# Patient Record
Sex: Male | Born: 1960 | Race: White | Hispanic: No | Marital: Married | Smoking: Former smoker
Health system: Southern US, Community
[De-identification: ages and names within clinical notes are randomized; demographics above are authoritative.]

## PROBLEM LIST (undated history)

## (undated) DIAGNOSIS — I10 Essential (primary) hypertension: Secondary | ICD-10-CM

## (undated) DIAGNOSIS — I219 Acute myocardial infarction, unspecified: Secondary | ICD-10-CM

## (undated) DIAGNOSIS — M549 Dorsalgia, unspecified: Secondary | ICD-10-CM

## (undated) DIAGNOSIS — Z87898 Personal history of other specified conditions: Secondary | ICD-10-CM

## (undated) DIAGNOSIS — K219 Gastro-esophageal reflux disease without esophagitis: Secondary | ICD-10-CM

## (undated) DIAGNOSIS — E119 Type 2 diabetes mellitus without complications: Secondary | ICD-10-CM

## (undated) DIAGNOSIS — I2510 Atherosclerotic heart disease of native coronary artery without angina pectoris: Secondary | ICD-10-CM

## (undated) DIAGNOSIS — M4712 Other spondylosis with myelopathy, cervical region: Secondary | ICD-10-CM

## (undated) DIAGNOSIS — M1990 Unspecified osteoarthritis, unspecified site: Secondary | ICD-10-CM

## (undated) DIAGNOSIS — E785 Hyperlipidemia, unspecified: Secondary | ICD-10-CM

## (undated) HISTORY — PX: TONSILLECTOMY: SUR1361

## (undated) HISTORY — DX: Dorsalgia, unspecified: M54.9

## (undated) HISTORY — PX: CORONARY ANGIOPLASTY: SHX604

## (undated) HISTORY — DX: Atherosclerotic heart disease of native coronary artery without angina pectoris: I25.10

## (undated) HISTORY — PX: WISDOM TOOTH EXTRACTION: SHX21

## (undated) HISTORY — DX: Hyperlipidemia, unspecified: E78.5

---

## 2009-06-01 DIAGNOSIS — I219 Acute myocardial infarction, unspecified: Secondary | ICD-10-CM

## 2009-06-01 HISTORY — DX: Acute myocardial infarction, unspecified: I21.9

## 2009-12-07 ENCOUNTER — Emergency Department: Payer: Self-pay | Admitting: Emergency Medicine

## 2010-01-09 ENCOUNTER — Encounter: Payer: Self-pay | Admitting: Cardiothoracic Surgery

## 2010-01-30 ENCOUNTER — Encounter: Payer: Self-pay | Admitting: Cardiothoracic Surgery

## 2010-03-01 ENCOUNTER — Encounter: Payer: Self-pay | Admitting: Cardiothoracic Surgery

## 2011-09-08 ENCOUNTER — Observation Stay: Payer: Self-pay | Admitting: Internal Medicine

## 2011-09-08 LAB — CBC
HGB: 14.2 g/dL (ref 13.0–18.0)
MCHC: 33.3 g/dL (ref 32.0–36.0)
MCV: 88 fL (ref 80–100)
RBC: 4.84 10*6/uL (ref 4.40–5.90)
RDW: 12.4 % (ref 11.5–14.5)

## 2011-09-08 LAB — COMPREHENSIVE METABOLIC PANEL
Alkaline Phosphatase: 83 U/L (ref 50–136)
Anion Gap: 8 (ref 7–16)
BUN: 14 mg/dL (ref 7–18)
Calcium, Total: 8.7 mg/dL (ref 8.5–10.1)
Chloride: 104 mmol/L (ref 98–107)
Co2: 27 mmol/L (ref 21–32)
Creatinine: 0.99 mg/dL (ref 0.60–1.30)
EGFR (African American): >60
Glucose: 139 mg/dL — ABNORMAL HIGH (ref 65–99)
Osmolality: 280 (ref 275–301)
Potassium: 3.1 mmol/L — ABNORMAL LOW (ref 3.5–5.1)
SGPT (ALT): 48 U/L
Total Protein: 7.3 g/dL (ref 6.4–8.2)

## 2011-09-08 LAB — MAGNESIUM: Magnesium: 1.6 mg/dL — ABNORMAL LOW

## 2011-09-08 LAB — CK TOTAL AND CKMB (NOT AT ARMC): CK, Total: 112 U/L (ref 35–232)

## 2011-09-09 LAB — POTASSIUM: Potassium: 3.8 mmol/L (ref 3.5–5.1)

## 2011-09-09 LAB — CK-MB
CK-MB: 0.8 ng/mL (ref 0.5–3.6)
CK-MB: 0.8 ng/mL (ref 0.5–3.6)

## 2011-09-09 LAB — MAGNESIUM: Magnesium: 1.6 mg/dL — ABNORMAL LOW

## 2011-09-09 LAB — TROPONIN I: Troponin-I: <0.02 ng/mL

## 2013-10-03 ENCOUNTER — Ambulatory Visit: Payer: Self-pay

## 2013-12-11 ENCOUNTER — Ambulatory Visit: Payer: Self-pay | Admitting: Neurosurgery

## 2013-12-29 ENCOUNTER — Other Ambulatory Visit: Payer: Self-pay | Admitting: Neurosurgery

## 2014-01-01 ENCOUNTER — Encounter (HOSPITAL_COMMUNITY)
Admission: RE | Admit: 2014-01-01 | Discharge: 2014-01-01 | Disposition: A | Discharge status: Home / Self Care | Payer: BC Managed Care – PPO | Source: Ambulatory Visit | Attending: Neurosurgery | Admitting: Neurosurgery

## 2014-01-01 ENCOUNTER — Encounter (HOSPITAL_COMMUNITY)
Admission: RE | Admit: 2014-01-01 | Discharge: 2014-01-01 | Disposition: A | Discharge status: Home / Self Care | Payer: BC Managed Care – PPO | Source: Ambulatory Visit | Attending: Anesthesiology | Admitting: Anesthesiology

## 2014-01-01 ENCOUNTER — Encounter (HOSPITAL_COMMUNITY): Payer: Self-pay

## 2014-01-01 DIAGNOSIS — Z01810 Encounter for preprocedural cardiovascular examination: Secondary | ICD-10-CM | POA: Insufficient documentation

## 2014-01-01 DIAGNOSIS — Z01818 Encounter for other preprocedural examination: Secondary | ICD-10-CM | POA: Insufficient documentation

## 2014-01-01 DIAGNOSIS — I252 Old myocardial infarction: Secondary | ICD-10-CM | POA: Insufficient documentation

## 2014-01-01 DIAGNOSIS — I10 Essential (primary) hypertension: Secondary | ICD-10-CM | POA: Insufficient documentation

## 2014-01-01 DIAGNOSIS — Z01812 Encounter for preprocedural laboratory examination: Secondary | ICD-10-CM | POA: Insufficient documentation

## 2014-01-01 DIAGNOSIS — K219 Gastro-esophageal reflux disease without esophagitis: Secondary | ICD-10-CM | POA: Insufficient documentation

## 2014-01-01 DIAGNOSIS — M47812 Spondylosis without myelopathy or radiculopathy, cervical region: Secondary | ICD-10-CM | POA: Insufficient documentation

## 2014-01-01 DIAGNOSIS — Z87891 Personal history of nicotine dependence: Secondary | ICD-10-CM | POA: Insufficient documentation

## 2014-01-01 HISTORY — DX: Other spondylosis with myelopathy, cervical region: M47.12

## 2014-01-01 HISTORY — DX: Acute myocardial infarction, unspecified: I21.9

## 2014-01-01 HISTORY — DX: Gastro-esophageal reflux disease without esophagitis: K21.9

## 2014-01-01 HISTORY — DX: Essential (primary) hypertension: I10

## 2014-01-01 HISTORY — DX: Personal history of other specified conditions: Z87.898

## 2014-01-01 LAB — SURGICAL PCR SCREEN
MRSA, PCR: NEGATIVE
STAPHYLOCOCCUS AUREUS: NEGATIVE

## 2014-01-01 LAB — BASIC METABOLIC PANEL
ANION GAP: 12 (ref 5–15)
BUN: 12 mg/dL (ref 6–23)
CALCIUM: 8.8 mg/dL (ref 8.4–10.5)
CO2: 23 meq/L (ref 19–32)
Chloride: 105 mEq/L (ref 96–112)
Creatinine, Ser: 0.81 mg/dL (ref 0.50–1.35)
Glucose, Bld: 105 mg/dL — ABNORMAL HIGH (ref 70–99)
Potassium: 4.8 mEq/L (ref 3.7–5.3)
SODIUM: 140 meq/L (ref 137–147)

## 2014-01-01 LAB — CBC
HEMATOCRIT: 38.2 % — AB (ref 39.0–52.0)
Hemoglobin: 13.6 g/dL (ref 13.0–17.0)
MCH: 31.1 pg (ref 26.0–34.0)
MCHC: 35.6 g/dL (ref 30.0–36.0)
MCV: 87.2 fL (ref 78.0–100.0)
PLATELETS: 162 10*3/uL (ref 150–400)
RBC: 4.38 MIL/uL (ref 4.22–5.81)
RDW: 13.6 % (ref 11.5–15.5)
WBC: 7.3 10*3/uL (ref 4.0–10.5)

## 2014-01-01 NOTE — Pre-Procedure Instructions (Signed)
Eddie Alexander  01/01/2014   Your procedure is scheduled on:  Monday, August 10  Report to [facility] Admitting at [place] AM.  Call this number if you have problems the morning of surgery: [phone]   Remember:   Do not eat food or drink liquids after midnight.Sunday night   Take these medicines the morning of surgery with A SIP OF WATER: Carvedilol   Do not wear jewelry.  Do not wear lotions, powders, or perfumes. You may wear deodorant.  Do not shave 48 hours prior to surgery. Men may shave face and neck.  Do not bring valuables to the hospital.  [place] is not responsible    for any belongings or valuables.               Contacts, dentures or bridgework may not be worn into surgery.  Leave suitcase in the car. After surgery it may be brought to your room.  For patients admitted to the hospital, discharge time is determined by your   treatment team.               Patients discharged the day of surgery will not be allowed to drive  home.  Name and phone number of your driver: Spouse Kerry  Special Instructions: [place] - Preparing for Surgery  Before surgery, you can play an important role.  Because skin is not sterile, your skin needs to be as free of germs as possible.  You can reduce the number of germs on you skin by washing with CHG (chlorahexidine gluconate) soap before surgery.  CHG is an antiseptic cleaner which kills germs and bonds with the skin to continue killing germs even after washing.  Please DO NOT use if you have an allergy to CHG or antibacterial soaps.  If your skin becomes reddened/irritated stop using the CHG and inform your nurse when you arrive at Short Stay.  Do not shave (including legs and underarms) for at least 48 hours prior to the first CHG shower.  You may shave your face.  Please follow these instructions carefully:   1.  Shower with CHG Soap the night before surgery and the    morning of Surgery.  2.  If you choose to wash your  hair, wash your hair first as usual with your  normal shampoo.  3.  After you shampoo, rinse your hair and body thoroughly to remove the  Shampoo.  4.  Use CHG as you would any other liquid soap.  You can apply chg directly to the skin and wash gently with scrungie or a clean washcloth.  5.  Apply the CHG Soap to your body ONLY FROM THE NECK DOWN.  Do not use on open wounds or open sores.  Avoid contact with your eyes, ears, mouth and genitals (private parts).  Wash genitals (private parts)   with your normal soap.  6.  Wash thoroughly, paying special attention to the area where your surgery  will be performed.  7.  Thoroughly rinse your body with warm water from the neck down.  8.  DO NOT shower/wash with your normal soap after using and rinsing off   the CHG Soap.  9.  Pat yourself dry with a clean towel.            10.  Wear clean pajamas.            11 .  Place clean sheets on your bed the night of your first  shower and do not sleep with pets.  Day of Surgery  Do not apply any lotions/deoderants the morning of surgery.  Please wear clean clothes to the hospital/surgery center.     Please read over the following fact sheets that you were given: Pain Booklet, Coughing and Deep Breathing and Surgical Site Infection Prevention

## 2014-01-01 NOTE — Progress Notes (Signed)
STOP-Bang score 5; results sent to PCP.

## 2014-01-02 NOTE — Progress Notes (Signed)
Anesthesia Chart Review:  Patient is a 53 year old male scheduled for C5-6. C6-7 ACDF on 01/08/14 by Dr. Lovell SheehanJenkins.  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  He had recurrent ischemia in 03/2012 s/p a second LAD stent (DES). His cardiologist is Dr. Celso AmyJ. Kevin [facility] who gave permission to temporarily hold Plavix on 12/14/13 and ASA one week prior to surgery. BMI is consistent with obesity. PCP is Dr. Vonita MossMark Crissman.  He had an abnormal stress echo on 03/03/12 (EF 50% with inferior defect, trivial TR) which lead to a cardiac cath on 03/23/12 (Care Everywhere) that showed:   Coronary arteries:  Left main: Normal  Left anterior descending: 10% proximal, 80% mid in stent  D2: (large) diffuse 30% mid  Left circumflex: 10% proximal  Ramus (large) 30% proximal  Right coronary: (dominant) 30-40% proximal  Impressions and Recommendations:  1. Single-vessel obstructive coronary artery disease with evidence of restenosis within the vision stents in the mid LAD  (note: Angiographically of the ramus, 2nd diagonal branch and the proximal right coronary artery look less severe in terms of angiographic stenosis than they did on the 2009 catheterization. Given the echo report of inferior ischemia we will proceed with FFR evaluation of the right coronary artery and plan repeat PCI of the LAD.)  Using bivalirudin and 600 mg of p.o. Plavix; FFR of the right coronary artery performed after intracoronary nitroglycerin and during intravenous adenosine infusion. 0.98 at baseline and a minimum of 0.90 during adenosine infusion. I therefore no PCI of the right coronary artery done.  PCI  Percutaneous coronary intervention performed of the 80% mid lesion within prior "bare metal" Vision stents..  Guide catheter: XB3.5  Devices: BMW  gw, 2.25/15 Quantum, 2.25/28 Xience DES deployed 14 atmospheres  Results: Good angiographic result  Impressions and Recommendations:  1-vessel coronary artery disease  Successful PCI  Aspirin 81 mg daily indefinitely  Clopidogrel (Plavix) 75 mg PO daily for at least 12 months.  EKG on 01/01/14 showed: NSR, septal infarct (age undetermined), possible lateral infarct (age undetermined). I can not view any of the previous EKG tracings in Care Everywhere, so I will see if Dr. Mort SawyersHarrison's office will fax a prior tracing copy.  Preoperative CXR and labs noted.   Based on currently available records, I would anticipate that he could proceed as planned since he has already been cleared by his cardiologist.   Shonna ChockAllison Tritia Endo, PA-C [facility]/Anesthesiology Phone [phone] 01/02/2014 3:13 PM

## 2014-01-07 MED ORDER — CEFAZOLIN SODIUM-DEXTROSE 2-3 GM-% IV SOLR: 2.0000 g | INTRAVENOUS | Status: DC

## 2014-01-08 ENCOUNTER — Encounter (HOSPITAL_COMMUNITY): Payer: Self-pay | Admitting: *Deleted

## 2014-01-08 ENCOUNTER — Encounter (HOSPITAL_COMMUNITY)
Admission: RE | Disposition: A | Discharge status: Home / Self Care | Payer: Self-pay | Source: Ambulatory Visit | Attending: Neurosurgery

## 2014-01-08 ENCOUNTER — Encounter (HOSPITAL_COMMUNITY): Payer: BC Managed Care – PPO | Admitting: Vascular Surgery

## 2014-01-08 ENCOUNTER — Ambulatory Visit (HOSPITAL_COMMUNITY): Payer: BC Managed Care – PPO | Admitting: Anesthesiology

## 2014-01-08 ENCOUNTER — Ambulatory Visit (HOSPITAL_COMMUNITY): Payer: BC Managed Care – PPO

## 2014-01-08 ENCOUNTER — Ambulatory Visit (HOSPITAL_COMMUNITY)
Admission: RE | Admit: 2014-01-08 | Discharge: 2014-01-09 | Disposition: A | Discharge status: Home / Self Care | Payer: BC Managed Care – PPO | Source: Ambulatory Visit | Attending: Neurosurgery | Admitting: Neurosurgery

## 2014-01-08 DIAGNOSIS — M5000 Cervical disc disorder with myelopathy, unspecified cervical region: Secondary | ICD-10-CM | POA: Diagnosis not present

## 2014-01-08 DIAGNOSIS — M4712 Other spondylosis with myelopathy, cervical region: Secondary | ICD-10-CM | POA: Insufficient documentation

## 2014-01-08 DIAGNOSIS — I252 Old myocardial infarction: Secondary | ICD-10-CM | POA: Insufficient documentation

## 2014-01-08 DIAGNOSIS — M4722 Other spondylosis with radiculopathy, cervical region: Secondary | ICD-10-CM

## 2014-01-08 DIAGNOSIS — Z7902 Long term (current) use of antithrombotics/antiplatelets: Secondary | ICD-10-CM | POA: Insufficient documentation

## 2014-01-08 DIAGNOSIS — Z87891 Personal history of nicotine dependence: Secondary | ICD-10-CM | POA: Insufficient documentation

## 2014-01-08 DIAGNOSIS — I10 Essential (primary) hypertension: Secondary | ICD-10-CM | POA: Diagnosis not present

## 2014-01-08 DIAGNOSIS — Z7982 Long term (current) use of aspirin: Secondary | ICD-10-CM | POA: Diagnosis not present

## 2014-01-08 DIAGNOSIS — Z6836 Body mass index (BMI) 36.0-36.9, adult: Secondary | ICD-10-CM | POA: Diagnosis not present

## 2014-01-08 DIAGNOSIS — Z9861 Coronary angioplasty status: Secondary | ICD-10-CM | POA: Diagnosis not present

## 2014-01-08 DIAGNOSIS — F430 Acute stress reaction: Secondary | ICD-10-CM | POA: Diagnosis not present

## 2014-01-08 HISTORY — PX: ANTERIOR CERVICAL DECOMP/DISCECTOMY FUSION: SHX1161

## 2014-01-08 SURGERY — ANTERIOR CERVICAL DECOMPRESSION/DISCECTOMY FUSION 2 LEVELS
Anesthesia: General | Site: Neck

## 2014-01-08 MED ORDER — PROMETHAZINE HCL 25 MG/ML IJ SOLN: 6.2500 mg | INTRAMUSCULAR | Status: DC | PRN

## 2014-01-08 MED ORDER — GLYCOPYRROLATE 0.2 MG/ML IJ SOLN
INTRAMUSCULAR | Status: AC
  Filled 2014-01-08: qty 3

## 2014-01-08 MED ORDER — LISINOPRIL 20 MG PO TABS
20.0000 mg | ORAL_TABLET | Freq: Every day | ORAL | Status: DC
  Administered 2014-01-08 – 2014-01-09 (×2): 20 mg via ORAL
  Filled 2014-01-08 (×2): qty 1

## 2014-01-08 MED ORDER — MORPHINE SULFATE 2 MG/ML IJ SOLN: 1.0000 mg | INTRAMUSCULAR | Status: DC | PRN

## 2014-01-08 MED ORDER — DEXAMETHASONE SODIUM PHOSPHATE 10 MG/ML IJ SOLN
INTRAMUSCULAR | Status: AC
  Filled 2014-01-08: qty 1

## 2014-01-08 MED ORDER — DEXAMETHASONE SODIUM PHOSPHATE 4 MG/ML IJ SOLN
4.0000 mg | Freq: Four times a day (QID) | INTRAMUSCULAR | Status: DC
  Filled 2014-01-08 (×3): qty 1

## 2014-01-08 MED ORDER — LIDOCAINE HCL (CARDIAC) 20 MG/ML IV SOLN
INTRAVENOUS | Status: AC
  Filled 2014-01-08: qty 5

## 2014-01-08 MED ORDER — ONDANSETRON HCL 4 MG/2ML IJ SOLN
INTRAMUSCULAR | Status: AC
  Filled 2014-01-08: qty 2

## 2014-01-08 MED ORDER — SUCCINYLCHOLINE CHLORIDE 20 MG/ML IJ SOLN
INTRAMUSCULAR | Status: AC
  Filled 2014-01-08: qty 1

## 2014-01-08 MED ORDER — HYDROMORPHONE HCL PF 1 MG/ML IJ SOLN: 0.2500 mg | INTRAMUSCULAR | Status: DC | PRN

## 2014-01-08 MED ORDER — BACITRACIN ZINC 500 UNIT/GM EX OINT
TOPICAL_OINTMENT | CUTANEOUS | Status: DC | PRN
  Administered 2014-01-08: 1 via TOPICAL

## 2014-01-08 MED ORDER — MENTHOL 3 MG MT LOZG
1.0000 | LOZENGE | OROMUCOSAL | Status: DC | PRN
  Filled 2014-01-08: qty 9

## 2014-01-08 MED ORDER — SODIUM CHLORIDE 0.9 % IR SOLN
Status: DC | PRN
  Administered 2014-01-08: 10:00:00

## 2014-01-08 MED ORDER — CEFAZOLIN SODIUM-DEXTROSE 2-3 GM-% IV SOLR
2.0000 g | Freq: Three times a day (TID) | INTRAVENOUS | Status: AC
  Administered 2014-01-08 – 2014-01-09 (×2): 2 g via INTRAVENOUS
  Filled 2014-01-08 (×2): qty 50

## 2014-01-08 MED ORDER — LACTATED RINGERS IV SOLN: INTRAVENOUS | Status: DC

## 2014-01-08 MED ORDER — GLYCOPYRROLATE 0.2 MG/ML IJ SOLN
INTRAMUSCULAR | Status: DC | PRN
  Administered 2014-01-08: .4 mg via INTRAVENOUS

## 2014-01-08 MED ORDER — DEXAMETHASONE SODIUM PHOSPHATE 10 MG/ML IJ SOLN
INTRAMUSCULAR | Status: DC | PRN
  Administered 2014-01-08: 10 mg via INTRAVENOUS

## 2014-01-08 MED ORDER — LACTATED RINGERS IV SOLN
INTRAVENOUS | Status: DC | PRN
  Administered 2014-01-08 (×2): via INTRAVENOUS

## 2014-01-08 MED ORDER — DEXAMETHASONE 4 MG PO TABS
4.0000 mg | ORAL_TABLET | Freq: Four times a day (QID) | ORAL | Status: DC
  Administered 2014-01-08 – 2014-01-09 (×3): 4 mg via ORAL
  Filled 2014-01-08 (×6): qty 1

## 2014-01-08 MED ORDER — DIAZEPAM 5 MG PO TABS
5.0000 mg | ORAL_TABLET | Freq: Four times a day (QID) | ORAL | Status: DC | PRN
  Administered 2014-01-08 – 2014-01-09 (×2): 5 mg via ORAL
  Filled 2014-01-08 (×2): qty 1

## 2014-01-08 MED ORDER — NEOSTIGMINE METHYLSULFATE 10 MG/10ML IV SOLN
INTRAVENOUS | Status: DC | PRN
  Administered 2014-01-08: 3 mg via INTRAVENOUS

## 2014-01-08 MED ORDER — DOCUSATE SODIUM 100 MG PO CAPS
100.0000 mg | ORAL_CAPSULE | Freq: Two times a day (BID) | ORAL | Status: DC
  Administered 2014-01-08 – 2014-01-09 (×2): 100 mg via ORAL
  Filled 2014-01-08 (×3): qty 1

## 2014-01-08 MED ORDER — PROPOFOL 10 MG/ML IV BOLUS
INTRAVENOUS | Status: DC | PRN
  Administered 2014-01-08: 20 mg via INTRAVENOUS
  Administered 2014-01-08: 200 mg via INTRAVENOUS

## 2014-01-08 MED ORDER — OXYCODONE-ACETAMINOPHEN 5-325 MG PO TABS
1.0000 | ORAL_TABLET | ORAL | Status: DC | PRN
  Administered 2014-01-08 – 2014-01-09 (×3): 1 via ORAL
  Filled 2014-01-08 (×3): qty 1

## 2014-01-08 MED ORDER — PROPOFOL 10 MG/ML IV BOLUS
INTRAVENOUS | Status: AC
  Filled 2014-01-08: qty 20

## 2014-01-08 MED ORDER — ONDANSETRON HCL 4 MG/2ML IJ SOLN
4.0000 mg | INTRAMUSCULAR | Status: DC | PRN
Start: 2014-01-08 — End: 2014-01-09

## 2014-01-08 MED ORDER — BUPIVACAINE-EPINEPHRINE (PF) 0.5% -1:200000 IJ SOLN
INTRAMUSCULAR | Status: DC | PRN
  Administered 2014-01-08: 10 mL via PERINEURAL

## 2014-01-08 MED ORDER — ONDANSETRON HCL 4 MG/2ML IJ SOLN
INTRAMUSCULAR | Status: DC | PRN
  Administered 2014-01-08: 4 mg via INTRAVENOUS

## 2014-01-08 MED ORDER — THROMBIN 5000 UNITS EX SOLR
CUTANEOUS | Status: DC | PRN
  Administered 2014-01-08 (×2): 5000 [IU] via TOPICAL

## 2014-01-08 MED ORDER — ROCURONIUM BROMIDE 100 MG/10ML IV SOLN
INTRAVENOUS | Status: DC | PRN
  Administered 2014-01-08: 20 mg via INTRAVENOUS
  Administered 2014-01-08: 10 mg via INTRAVENOUS
  Administered 2014-01-08: 50 mg via INTRAVENOUS
  Administered 2014-01-08: 20 mg via INTRAVENOUS

## 2014-01-08 MED ORDER — ACETAMINOPHEN 650 MG RE SUPP: 650.0000 mg | RECTAL | Status: DC | PRN

## 2014-01-08 MED ORDER — FENTANYL CITRATE 0.05 MG/ML IJ SOLN
INTRAMUSCULAR | Status: AC
  Filled 2014-01-08: qty 5

## 2014-01-08 MED ORDER — MIDAZOLAM HCL 2 MG/2ML IJ SOLN: 0.5000 mg | Freq: Once | INTRAMUSCULAR | Status: DC | PRN

## 2014-01-08 MED ORDER — ROCURONIUM BROMIDE 50 MG/5ML IV SOLN
INTRAVENOUS | Status: AC
  Filled 2014-01-08: qty 1

## 2014-01-08 MED ORDER — PHENYLEPHRINE HCL 10 MG/ML IJ SOLN
10.0000 mg | INTRAVENOUS | Status: DC | PRN
  Administered 2014-01-08: 10 ug/min via INTRAVENOUS

## 2014-01-08 MED ORDER — FENTANYL CITRATE 0.05 MG/ML IJ SOLN
INTRAMUSCULAR | Status: DC | PRN
  Administered 2014-01-08: 50 ug via INTRAVENOUS
  Administered 2014-01-08: 250 ug via INTRAVENOUS
  Administered 2014-01-08: 50 ug via INTRAVENOUS
  Administered 2014-01-08: 100 ug via INTRAVENOUS
  Administered 2014-01-08: 50 ug via INTRAVENOUS

## 2014-01-08 MED ORDER — PROPOFOL 10 MG/ML IV BOLUS
INTRAVENOUS | Status: AC
Start: 2014-01-08 — End: 2014-01-08
  Filled 2014-01-08: qty 20

## 2014-01-08 MED ORDER — MIDAZOLAM HCL 2 MG/2ML IJ SOLN
INTRAMUSCULAR | Status: AC
  Filled 2014-01-08: qty 2

## 2014-01-08 MED ORDER — ACETAMINOPHEN 325 MG PO TABS: 650.0000 mg | ORAL_TABLET | ORAL | Status: DC | PRN

## 2014-01-08 MED ORDER — OXYCODONE HCL 5 MG PO TABS: 5.0000 mg | ORAL_TABLET | Freq: Once | ORAL | Status: DC | PRN

## 2014-01-08 MED ORDER — LACTATED RINGERS IV SOLN
INTRAVENOUS | Status: DC
  Administered 2014-01-08: 50 mL/h via INTRAVENOUS

## 2014-01-08 MED ORDER — MEPERIDINE HCL 25 MG/ML IJ SOLN: 6.2500 mg | INTRAMUSCULAR | Status: DC | PRN

## 2014-01-08 MED ORDER — MIDAZOLAM HCL 5 MG/5ML IJ SOLN
INTRAMUSCULAR | Status: DC | PRN
  Administered 2014-01-08: 2 mg via INTRAVENOUS

## 2014-01-08 MED ORDER — HYDROCODONE-ACETAMINOPHEN 5-325 MG PO TABS: 1.0000 | ORAL_TABLET | ORAL | Status: DC | PRN

## 2014-01-08 MED ORDER — HEMOSTATIC AGENTS (NO CHARGE) OPTIME
TOPICAL | Status: DC | PRN
  Administered 2014-01-08: 1 via TOPICAL

## 2014-01-08 MED ORDER — PHENOL 1.4 % MT LIQD: 1.0000 | OROMUCOSAL | Status: DC | PRN

## 2014-01-08 MED ORDER — CARVEDILOL 12.5 MG PO TABS
12.5000 mg | ORAL_TABLET | Freq: Two times a day (BID) | ORAL | Status: DC
  Administered 2014-01-08 – 2014-01-09 (×2): 12.5 mg via ORAL
  Filled 2014-01-08 (×4): qty 1

## 2014-01-08 MED ORDER — ALUM & MAG HYDROXIDE-SIMETH 200-200-20 MG/5ML PO SUSP: 30.0000 mL | Freq: Four times a day (QID) | ORAL | Status: DC | PRN

## 2014-01-08 MED ORDER — ATORVASTATIN CALCIUM 20 MG PO TABS
20.0000 mg | ORAL_TABLET | Freq: Every day | ORAL | Status: DC
  Administered 2014-01-08: 20 mg via ORAL
  Filled 2014-01-08 (×2): qty 1

## 2014-01-08 MED ORDER — NEOSTIGMINE METHYLSULFATE 10 MG/10ML IV SOLN
INTRAVENOUS | Status: AC
  Filled 2014-01-08: qty 1

## 2014-01-08 MED ORDER — OXYCODONE HCL 5 MG/5ML PO SOLN: 5.0000 mg | Freq: Once | ORAL | Status: DC | PRN

## 2014-01-08 MED ORDER — LIDOCAINE HCL (CARDIAC) 20 MG/ML IV SOLN
INTRAVENOUS | Status: DC | PRN
  Administered 2014-01-08: 40 mg via INTRAVENOUS

## 2014-01-08 MED ORDER — CEFAZOLIN SODIUM-DEXTROSE 2-3 GM-% IV SOLR
INTRAVENOUS | Status: AC
  Administered 2014-01-08: 2 g via INTRAVENOUS
  Filled 2014-01-08: qty 50

## 2014-01-08 SURGICAL SUPPLY — 67 items
BAG DECANTER FOR FLEXI CONT (MISCELLANEOUS) ×3 IMPLANT
BENZOIN TINCTURE PRP APPL 2/3 (GAUZE/BANDAGES/DRESSINGS) ×3 IMPLANT
BIT DRILL NEURO 2X3.1 SFT TUCH (MISCELLANEOUS) ×1 IMPLANT
BLADE SURG 15 STRL LF DISP TIS (BLADE) ×1 IMPLANT
BLADE SURG 15 STRL SS (BLADE) ×2
BLADE ULTRA TIP 2M (BLADE) ×3 IMPLANT
BRUSH SCRUB EZ PLAIN DRY (MISCELLANEOUS) ×3 IMPLANT
BUR BARREL STRAIGHT FLUTE 4.0 (BURR) ×3 IMPLANT
BUR MATCHSTICK NEURO 3.0 LAGG (BURR) ×3 IMPLANT
CANISTER SUCT 3000ML (MISCELLANEOUS) ×3 IMPLANT
CLOSURE WOUND 1/2 X4 (GAUZE/BANDAGES/DRESSINGS) ×1
CONT SPEC 4OZ CLIKSEAL STRL BL (MISCELLANEOUS) ×3 IMPLANT
COVER MAYO STAND STRL (DRAPES) ×3 IMPLANT
DRAPE LAPAROTOMY 100X72 PEDS (DRAPES) ×3 IMPLANT
DRAPE MICROSCOPE LEICA (MISCELLANEOUS) IMPLANT
DRAPE POUCH INSTRU U-SHP 10X18 (DRAPES) ×3 IMPLANT
DRAPE SURG 17X23 STRL (DRAPES) ×6 IMPLANT
DRILL NEURO 2X3.1 SOFT TOUCH (MISCELLANEOUS) ×3
ELECT BLADE 4.0 EZ CLEAN MEGAD (MISCELLANEOUS) ×3
ELECT REM PT RETURN 9FT ADLT (ELECTROSURGICAL) ×3
ELECTRODE BLDE 4.0 EZ CLN MEGD (MISCELLANEOUS) ×1 IMPLANT
ELECTRODE REM PT RTRN 9FT ADLT (ELECTROSURGICAL) ×1 IMPLANT
GAUZE SPONGE 4X4 12PLY STRL (GAUZE/BANDAGES/DRESSINGS) ×3 IMPLANT
GAUZE SPONGE 4X4 16PLY XRAY LF (GAUZE/BANDAGES/DRESSINGS) IMPLANT
GLOVE BIO SURGEON STRL SZ8.5 (GLOVE) ×3 IMPLANT
GLOVE BIOGEL PI IND STRL 7.5 (GLOVE) ×1 IMPLANT
GLOVE BIOGEL PI IND STRL 8.5 (GLOVE) ×1 IMPLANT
GLOVE BIOGEL PI INDICATOR 7.5 (GLOVE) ×2
GLOVE BIOGEL PI INDICATOR 8.5 (GLOVE) ×2
GLOVE ECLIPSE 8.5 STRL (GLOVE) ×3 IMPLANT
GLOVE EXAM NITRILE LRG STRL (GLOVE) IMPLANT
GLOVE EXAM NITRILE MD LF STRL (GLOVE) ×3 IMPLANT
GLOVE EXAM NITRILE XL STR (GLOVE) IMPLANT
GLOVE EXAM NITRILE XS STR PU (GLOVE) IMPLANT
GLOVE SS BIOGEL STRL SZ 8 (GLOVE) ×1 IMPLANT
GLOVE SUPERSENSE BIOGEL SZ 8 (GLOVE) ×2
GLOVE SURG SS PI 7.0 STRL IVOR (GLOVE) ×6 IMPLANT
GOWN STRL REUS W/ TWL LRG LVL3 (GOWN DISPOSABLE) ×1 IMPLANT
GOWN STRL REUS W/ TWL XL LVL3 (GOWN DISPOSABLE) ×2 IMPLANT
GOWN STRL REUS W/TWL LRG LVL3 (GOWN DISPOSABLE) ×2
GOWN STRL REUS W/TWL XL LVL3 (GOWN DISPOSABLE) ×4
KIT BASIN OR (CUSTOM PROCEDURE TRAY) ×3 IMPLANT
KIT ROOM TURNOVER OR (KITS) ×3 IMPLANT
MARKER SKIN DUAL TIP RULER LAB (MISCELLANEOUS) ×3 IMPLANT
NEEDLE HYPO 22GX1.5 SAFETY (NEEDLE) ×3 IMPLANT
NEEDLE SPNL 18GX3.5 QUINCKE PK (NEEDLE) ×3 IMPLANT
NS IRRIG 1000ML POUR BTL (IV SOLUTION) ×3 IMPLANT
PACK LAMINECTOMY NEURO (CUSTOM PROCEDURE TRAY) ×3 IMPLANT
PATTIES SURGICAL .5 X.5 (GAUZE/BANDAGES/DRESSINGS) ×3 IMPLANT
PEEK VISTA 14X14X7MM (Peek) ×3 IMPLANT
PEEK VISTA 14X14X8MM (Peek) ×3 IMPLANT
PIN DISTRACTION 14MM (PIN) ×6 IMPLANT
PLATE ANT CERV XTEND 2 LV (Plate) ×3 IMPLANT
PUTTY 2.5ML ACTIFUSE ABX (Putty) ×3 IMPLANT
RUBBERBAND STERILE (MISCELLANEOUS) IMPLANT
SCREW XTD VAR 4.2 SELF TAP (Screw) ×18 IMPLANT
SPONGE INTESTINAL PEANUT (DISPOSABLE) ×6 IMPLANT
SPONGE SURGIFOAM ABS GEL SZ50 (HEMOSTASIS) ×3 IMPLANT
STRIP CLOSURE SKIN 1/2X4 (GAUZE/BANDAGES/DRESSINGS) ×2 IMPLANT
SUT VIC AB 0 CT1 27 (SUTURE) ×2
SUT VIC AB 0 CT1 27XBRD ANTBC (SUTURE) ×1 IMPLANT
SUT VIC AB 3-0 SH 8-18 (SUTURE) ×3 IMPLANT
SYR 20ML ECCENTRIC (SYRINGE) ×3 IMPLANT
TAPE CLOTH SURG 4X10 WHT LF (GAUZE/BANDAGES/DRESSINGS) ×3 IMPLANT
TOWEL OR 17X24 6PK STRL BLUE (TOWEL DISPOSABLE) ×3 IMPLANT
TOWEL OR 17X26 10 PK STRL BLUE (TOWEL DISPOSABLE) ×3 IMPLANT
WATER STERILE IRR 1000ML POUR (IV SOLUTION) ×3 IMPLANT

## 2014-01-08 NOTE — H&P (Signed)
Subjective: The patient is a 53 year old white male who has complained of neck and on pain consistent with a cervical radiculopathy. He has failed medical management and was worked up with a cervical MRI. This demonstrated this degeneration and spondylosis/stenosis most prominent at C5-6 and C6-7. I discussed the various treatment options with the patient including surgery. He has weighed the risks, benefits, and alternatives surgery and decided proceed with a C5-6 and C6-7 anterior cervical discectomy, fusion, and plating.   Past Medical History  Diagnosis Date  . Myocardial infarction 2011    Sees Dr Romeo AppleHarrison @ [facility] annual[facility] s/p stent  . Hypertension   . GERD (gastroesophageal reflux disease)   . H/O urinary frequency   . Cervical spondylosis with myelopathy     Past Surgical History  Procedure Laterality Date  . Coronary angioplasty  2011,2013    [facility]  . Tonsillectomy      No Known Allergies  History  Substance Use Topics  . Smoking status: Former Smoker    Types: Cigars    Quit date: 01/01/2013  . Smokeless tobacco: Never Used  . Alcohol Use: Not on file    History reviewed. No pertinent family history. Prior to Admission medications   Medication Sig Start Date End Date Taking? Authorizing Provider  atorvastatin (LIPITOR) 20 MG tablet Take 20 mg by mouth daily.   Yes Historical Provider, MD  carvedilol (COREG) 12.5 MG tablet Take 12.5 mg by mouth 2 (two) times daily with a meal.   Yes Historical Provider, MD  etodolac (LODINE) 400 MG tablet Take 400 mg by mouth 2 (two) times daily as needed for mild pain.   Yes Historical Provider, MD  HYDROcodone-acetaminophen (NORCO/VICODIN) 5-325 MG per tablet Take 1 tablet by mouth every 6 (six) hours as needed for moderate pain.   Yes Historical Provider, MD  lisinopril (PRINIVIL,ZESTRIL) 20 MG tablet Take 20 mg by mouth daily.   Yes Historical Provider, MD  aspirin EC 81 MG tablet Take 81 mg by mouth daily.     Historical Provider, MD  clopidogrel (PLAVIX) 75 MG tablet Take 75 mg by mouth daily.    Historical Provider, MD  Omega-3 Fatty Acids (FISH OIL) 1200 MG CAPS Take 1,200 mg by mouth daily.    Historical Provider, MD     Review of Systems  Positive ROS: As above  All other systems have been reviewed and were otherwise negative with the exception of those mentioned in the HPI and as above.  Objective: Vital signs in last 24 hours: Temp:  [98.3 F (36.8 C)] 98.3 F (36.8 C) (08/10 0750) Pulse Rate:  [73] 73 (08/10 0750) Resp:  [20] 20 (08/10 0750) BP: (162-165)/(100-101) 162/100 mmHg (08/10 0758) SpO2:  [99 %] 99 % (08/10 0750) Weight:  [120.43 kg (265 lb 8 oz)] 120.43 kg (265 lb 8 oz) (08/10 0750)  General Appearance: Alert, cooperative, no distress, Head: Normocephalic, without obvious abnormality, atraumatic Eyes: PERRL, conjunctiva/corneas clear, EOM's intact,    Ears: Normal  Throat: Normal  Neck: Supple, symmetrical, trachea midline, no adenopathy; thyroid: No enlargement/tenderness/nodules; no carotid bruit or JVD Back: Symmetric, no curvature, ROM normal, no CVA tenderness Lungs: Clear to auscultation bilaterally, respirations unlabored Heart: Regular rate and rhythm, no murmur, rub or gallop Abdomen: Soft, non-tender,, no masses, no organomegaly Extremities: Extremities normal, atraumatic, no cyanosis or edema Pulses: 2+ and symmetric all extremities Skin: Skin color, texture, turgor normal, no rashes or lesions  NEUROLOGIC:   Mental status: alert and  oriented, no aphasia, good attention span, Fund of knowledge/ memory ok Motor Exam - grossly normal Sensory Exam - grossly normal Reflexes:  Coordination - grossly normal Gait - grossly normal Balance - grossly normal Cranial Nerves: I: smell Not tested  II: visual acuity  OS: Normal  OD: Normal   II: visual fields Full to confrontation  II: pupils Equal, round, reactive to light  III,VII: ptosis None  III,IV,VI:  extraocular muscles  Full ROM  V: mastication Normal  V: facial light touch sensation  Normal  V,VII: corneal reflex  Present  VII: facial muscle function - upper  Normal  VII: facial muscle function - lower Normal  VIII: hearing Not tested  IX: soft palate elevation  Normal  IX,X: gag reflex Present  XI: trapezius strength  5/5  XI: sternocleidomastoid strength 5/5  XI: neck flexion strength  5/5  XII: tongue strength  Normal    Data Review Lab Results  Component Value Date   WBC 7.3 01/01/2014   HGB 13.6 01/01/2014   HCT 38.2* 01/01/2014   MCV 87.2 01/01/2014   PLT 162 01/01/2014   Lab Results  Component Value Date   NA 140 01/01/2014   K 4.8 01/01/2014   CL 105 01/01/2014   CO2 23 01/01/2014   BUN 12 01/01/2014   CREATININE 0.81 01/01/2014   GLUCOSE 105* 01/01/2014   No results found for this basename: INR, PROTIME    Assessment/Plan: C5-C6 and C6-7 disc degeneration, spondylosis, stenosis, cervical radiculopathy, cervicalgia: I discussed the situation with the patient. I reviewed his imaging studies with them and pointed out the abnormalities. We have discussed the various treatment options including surgery. I have described the surgical treatment option of of a C5-6 and C6-7 anterior cervical discectomy, fusion, and plating. I have shown him surgical models. We have discussed the risks, benefits, alternatives, and likelihood of achieving our goals with surgery. I have answered all the patient's questions. He has decided proceed with surgery.   Evalyse Stroope D 01/08/2014 9:09 AM

## 2014-01-08 NOTE — Anesthesia Postprocedure Evaluation (Signed)
  Anesthesia Post-op Note  Patient: Eddie Alexander  Procedure(s) Performed: Procedure(s) with comments: ANTERIOR CERVICAL DECOMPRESSION/DISCECTOMY FUSION 2 LEVELS (N/A) - Cervical Five-Six/Six-Seven Anterior Cervical Decompression with Fusoin interbody prosthesis plating and bonegraft  Patient Location: PACU  Anesthesia Type:General  Level of Consciousness: awake, alert , oriented and patient cooperative  Airway and Oxygen Therapy: Patient Spontanous Breathing and Patient connected to nasal cannula oxygen  Post-op Pain: mild  Post-op Assessment: Post-op Vital signs reviewed, Patient's Cardiovascular Status Stable, Respiratory Function Stable, Patent Airway, No signs of Nausea or vomiting and Pain level controlled  Post-op Vital Signs: Reviewed and stable  Last Vitals:  Filed Vitals:   01/08/14 1502  BP:   Pulse: 74  Temp: 36.4 C  Resp: 18    Complications: No apparent anesthesia complications

## 2014-01-08 NOTE — Anesthesia Procedure Notes (Signed)
Procedure Name: Intubation Date/Time: 01/08/2014 9:55 AM Performed by: Coralee RudFLORES, Najib Colmenares Pre-anesthesia Checklist: Patient identified, Emergency Drugs available, Suction available and Patient being monitored Patient Re-evaluated:Patient Re-evaluated prior to inductionOxygen Delivery Method: Circle system utilized Preoxygenation: Pre-oxygenation with 100% oxygen Intubation Type: IV induction Ventilation: Mask ventilation without difficulty and Oral airway inserted - appropriate to patient size Laryngoscope size: Elective Glidescope. Grade View: Grade I Tube type: Oral Tube size: 8.0 mm Number of attempts: 1 Airway Equipment and Method: Stylet and Video-laryngoscopy Placement Confirmation: ETT inserted through vocal cords under direct vision,  positive ETCO2 and breath sounds checked- equal and bilateral Secured at: 22 cm Dental Injury: Teeth and Oropharynx as per pre-operative assessment  Comments: Patient myelopathic, elective Glidescope

## 2014-01-08 NOTE — Anesthesia Preprocedure Evaluation (Addendum)
Anesthesia Evaluation  Patient identified by MRN, date of birth, ID band Patient awake    Reviewed: Allergy & Precautions, H&P , NPO status , Patient's Chart, lab work & pertinent test results, reviewed documented beta blocker date and time   History of Anesthesia Complications Negative for: history of anesthetic complications  Airway Mallampati: II TM Distance: >3 FB Neck ROM: Full    Dental  (+) Teeth Intact, Dental Advisory Given   Pulmonary former smoker (quit '14),  breath sounds clear to auscultation        Cardiovascular hypertension, Pt. on medications and Pt. on home beta blockers - angina+ CAD ('13 LAD stented, otherwise non-obstructive ), + Past MI ('11) and + Cardiac Stents (plavix, DES x2 LAD) Rhythm:Regular Rate:Normal  '13 ECHO: EF 50%, valves Ok   Neuro/Psych    GI/Hepatic negative GI ROS, Neg liver ROS,   Endo/Other  Morbid obesity  Renal/GU negative Renal ROS     Musculoskeletal   Abdominal (+) + obese,   Peds  Hematology negative hematology ROS (+) Off plavix   Anesthesia Other Findings   Reproductive/Obstetrics                        Anesthesia Physical Anesthesia Plan  ASA: III  Anesthesia Plan: General   Post-op Pain Management:    Induction: Intravenous  Airway Management Planned: Oral ETT and Video Laryngoscope Planned  Additional Equipment:   Intra-op Plan:   Post-operative Plan: Extubation in OR  Informed Consent: I have reviewed the patients History and Physical, chart, labs and discussed the procedure including the risks, benefits and alternatives for the proposed anesthesia with the patient or authorized representative who has indicated his/her understanding and acceptance.   Dental advisory given  Plan Discussed with: CRNA and Surgeon  Anesthesia Plan Comments: (Plan routine monitors, GETA with VideoGlide intubation )        Anesthesia Quick  Evaluation

## 2014-01-08 NOTE — Op Note (Signed)
Brief history: The patient is a 53 year old white male who has presented with neck and arm pain consistent with a cervical radiculopathy/myelopathy. He has failed medical management and was worked up with a cervical MRI. This demonstrated this degeneration, spondylosis, stenosis, etc. at C5-6 and C6-7 with evidence of spinal cord signal change. I discussed the various treatment option with the patient including surgery. He has weighed the risks, benefits, and alternatives surgery and decided to proceed with a C5-6 and C6-7 anterior cervical discectomy, fusion, and plating.  Preoperative diagnosis: C5-6 and C6-7 disc degeneration, spondylosis, stenosis, cervicalgia, cervical radiculopathy, cervical myelopathy  Postoperative diagnosis: The same  Procedure: C5-6 and C6-7 Anterior cervical discectomy/decompression; C5-C6 and C6-7 interbody arthrodesis with local morcellized autograft bone and Actifuse bone graft extender; insertion of interbody prosthesis at C5-6 and C6-7 (Zimmer peek interbody prosthesis); anterior cervical plating from C5-C7 with globus titanium plate  Surgeon: Dr. Delma Officer  Asst.: Dr. Barnett Abu  Anesthesia: Gen. endotracheal  Estimated blood loss: 100 cc  Drains: None  Complications: None  Description of procedure: The patient was brought to the operating room by the anesthesia team. General endotracheal anesthesia was induced. A roll was placed under the patient's shoulders to keep the neck in the neutral position. The patient's anterior cervical region was then prepared with Betadine scrub and Betadine solution. Sterile drapes were applied.  The area to be incised was then injected with Marcaine with epinephrine solution. I then used a scalpel to make a transverse incision in the patient's left anterior neck. I used the Metzenbaum scissors to divide the platysmal muscle and then to dissect medial to the sternocleidomastoid muscle, jugular vein, and carotid artery. I  carefully dissected down towards the anterior cervical spine identifying the esophagus and retracting it medially. Then using Kitner swabs to clear soft tissue from the anterior cervical spine. We then inserted a bent spinal needle into the upper exposed intervertebral disc space. We then obtained intraoperative radiographs confirm our location.  I then used electrocautery to detach the medial border of the longus colli muscle bilaterally from the C5- C6 and C6-7 intervertebral disc spaces. I then inserted the Caspar self-retaining retractor underneath the longus colli muscle bilaterally to provide exposure.  We then incised the intervertebral disc at C5-6. We then performed a partial intervertebral discectomy with a pituitary forceps and the Karlin curettes. I then inserted distraction screws into the vertebral bodies at C5 and C6. We then distracted the interspace. We then used the high-speed drill to decorticate the vertebral endplates at C5-6, to drill away the remainder of the intervertebral disc, to drill away some posterior spondylosis, and to thin out the posterior longitudinal ligament. I then incised ligament with the arachnoid knife. We then removed the ligament with a Kerrison punches undercutting the vertebral endplates and decompressing the thecal sac. We then performed foraminotomies about the bilateral C6 nerve roots. This completed the decompression at this level.  I then repeated this procedure in an analogous fashion at C6-7 decompressing the thecal sac and the bilateral C7 nerve roots.  We now turned our to attention to the interbody fusion. We used the trial spacers to determine the appropriate size for the interbody prosthesis. We then pre-filled prosthesis with a combination of local morcellized autograft bone that we obtained during decompression as well as Actifuse bone graft extender. We then inserted the prosthesis into the distracted interspace at C5-6 and C6-7. We then removed  the distraction screws. There was a good snug fit of the  prosthesis in the interspace.   Having completed the fusion we now turned attention to the anterior spinal instrumentation. We used the high-speed drill to drill away some anterior spondylosis at the disc spaces so that the plate lay down flat. We selected the appropriate length titanium anterior cervical plate. We laid it along the anterior aspect of the vertebral bodies from C5-C7. We then drilled 14 mm holes at C5, C6 and C7. We then secured the plate to the vertebral bodies by placing two 14 mm self-tapping screws at C5, C6 and C7. We then obtained intraoperative radiograph. We could not see the instrumentation on the x-ray because of the patient's body habitus. The construct looked good in vivo. We therefore secured the screws the plate the locking each cam. This completed the instrumentation.  We then obtained hemostasis using bipolar electrocautery. We irrigated the wound out with bacitracin solution. We then removed the retractor. We inspected the esophagus for any damage. There was none apparent. We then reapproximated patient's platysmal muscle with interrupted 3-0 Vicryl suture. We then reapproximated the subcutaneous tissue with interrupted 3-0 Vicryl suture. The skin was reapproximated with Steri-Strips and benzoin. The wound was then covered with bacitracin ointment. A sterile dressing was applied. The drapes were removed. Patient was subsequently extubated by the anesthesia team and transported to the post anesthesia care unit in stable condition. All sponge instrument and needle counts were reportedly correct at the end of this case.

## 2014-01-08 NOTE — Progress Notes (Signed)
Patient ID: Eddie Alexander, male   DOB: 1960/06/25, 53 y.o.   MRN: [id number] Subjective:  The patient is somnolent but easily arousable. He is in no apparent distress. He looks well.  Objective: Vital signs in last 24 hours: Temp:  [98.3 F (36.8 C)-99 F (37.2 C)] 99 F (37.2 C) (08/10 1313) Pulse Rate:  [73-82] 82 (08/10 1316) Resp:  [20-25] 25 (08/10 1316) BP: (143-165)/(88-101) 143/88 mmHg (08/10 1316) SpO2:  [95 %-99 %] 95 % (08/10 1316) Weight:  [120.43 kg (265 lb 8 oz)] 120.43 kg (265 lb 8 oz) (08/10 0750)  Intake/Output from previous day:   Intake/Output this shift: Total I/O In: 1500 [I.V.:1500] Out: 150 [Blood:150]  Physical exam the patient is somnolent but arousable. He is moving all 4 extremities well. His dressing is clean and dry. There is no evidence of hematoma or shift.  Lab Results: No results found for this basename: WBC, HGB, HCT, PLT,  in the last 72 hours BMET No results found for this basename: NA, K, CL, CO2, GLUCOSE, BUN, CREATININE, CALCIUM,  in the last 72 hours  Studies/Results: Dg Cervical Spine 2-3 Views  01/08/2014   CLINICAL DATA:  Anterior cervical decompression with discectomy and fusion at 2 levels.  EXAM: CERVICAL SPINE - 2-3 VIEW  COMPARISON:  None.  FINDINGS: Initial image labeled #1 at 1036 hr demonstrates patient intubation and a lateral view of the cervical spine with a sharp tip probe at the C3-4 intervertebral level.  The next image, labeled #2, demonstrates surgical sponge in place anterior to C3, C4, and C5. If there is another probe in place, I do not visualize it.  IMPRESSION: 1. The first image demonstrates probe localization of C3-4. The second image demonstrates sponge material in place anterior to C3, C4, and C5, but I do not see another definite probe. The cervical spine from C5 down is obscured by the patient's shoulders.   Electronically Signed   By: Herbie BaltimoreWalt  Alexander M.D.   On: 01/08/2014 13:06    Assessment/Plan: The patient  is doing well. I spoke with his family.  LOS: 0 days     Caral Whan D 01/08/2014, 1:31 PM

## 2014-01-08 NOTE — Transfer of Care (Signed)
Immediate Anesthesia Transfer of Care Note  Patient: Eddie Alexander  Procedure(s) Performed: Procedure(s) with comments: ANTERIOR CERVICAL DECOMPRESSION/DISCECTOMY FUSION 2 LEVELS (N/A) - Cervical Five-Six/Six-Seven Anterior Cervical Decompression with Fusoin interbody prosthesis plating and bonegraft  Patient Location: PACU  Anesthesia Type:General  Level of Consciousness: awake, sedated and patient cooperative  Airway & Oxygen Therapy: Patient Spontanous Breathing and Patient connected to nasal cannula oxygen  Post-op Assessment: Report given to PACU RN, Post -op Vital signs reviewed and stable and Patient moving all extremities  Post vital signs: Reviewed and stable  Complications: No apparent anesthesia complications

## 2014-01-09 DIAGNOSIS — M4712 Other spondylosis with myelopathy, cervical region: Secondary | ICD-10-CM | POA: Diagnosis not present

## 2014-01-09 MED ORDER — DIAZEPAM 5 MG PO TABS: 5.0000 mg | ORAL_TABLET | Freq: Four times a day (QID) | ORAL | Status: DC | PRN

## 2014-01-09 MED ORDER — DSS 100 MG PO CAPS: 100.0000 mg | ORAL_CAPSULE | Freq: Two times a day (BID) | ORAL | Status: DC

## 2014-01-09 NOTE — Progress Notes (Signed)
Pt doing well. Pt and wife given D/C instructions with Rx's, verbal understanding of teaching was provided. Pt's IV was removed prior to D/C. Pt's incision is covered with gauze dressing and is clean, dry, and intact. Pt has Aspen collar per MD order. Pt D/C'd home via wheelchair @ [phone] per MD order. Pt is stable @ D/C and has no other needs at this time. Rema FendtAshley Roma Bierlein, RN

## 2014-01-09 NOTE — Evaluation (Signed)
Physical Therapy Evaluation/ Discharge Patient Details Name: Eddie Alexander MRN: [id number] DOB: [birth year] Today's Date: 01/09/2014   History of Present Illness  Pt admitted for ACDF c5-7  Clinical Impression  Pt very pleasant without pain after sx. Pt with 6 mo of left hip circumduction with gait due to dorsiflexion weakness. Pt with bil UE and bil LE strength and sensation WFL with myotome testing all areas 5/5. However, pt continues to demonstrate LLE circumduction and toe drag with fatigue. Pt educated for toe raises, gait normalization with use of visual feedback of mirror or hip belt. Pt stated understanding and demonstrated such. Pt educated for all precautions with transfers, mobility, gait, ADLs and function as well as encouragement to remove throw rugs as tripping hazard. Pt with all acute education provided with teach back and return demonstration. Recommend OPPT should gait deviation persist.     Follow Up Recommendations Outpatient PT    Equipment Recommendations  None recommended by PT    Recommendations for Other Services       Precautions / Restrictions Precautions Precautions: Fall;Cervical Precaution Comments: left toe drag with fatigue Required Braces or Orthoses: Cervical Brace Cervical Brace: At all times;Hard collar Restrictions Weight Bearing Restrictions: No      Mobility  Bed Mobility Overal bed mobility: Modified Independent Bed Mobility: Rolling;Sidelying to Sit Rolling: Modified independent (Device/Increase time) Sidelying to sit: Modified independent (Device/Increase time)       General bed mobility comments: mod I after initial education and cueing  Transfers Overall transfer level: Modified independent                  Ambulation/Gait Ambulation/Gait assistance: Supervision Ambulation Distance (Feet): 400 Feet   Gait Pattern/deviations: Step-through pattern   Gait velocity interpretation: Below normal speed for  age/gender General Gait Details: pt with increased circumduction of Left hip with fatigue and increased distance. Cues for speed and awareness of gait deviation to correct  Stairs Stairs: Yes Stairs assistance: Modified independent (Device/Increase time) Stair Management: One rail Right;Alternating pattern;Forwards Number of Stairs: 11 General stair comments: slow speed to fully clear LLE  Wheelchair Mobility    Modified Rankin (Stroke Patients Only)       Balance Overall balance assessment: Needs assistance   Sitting balance-Leahy Scale: Normal       Standing balance-Leahy Scale: Good                               Pertinent Vitals/Pain Pain Assessment: No/denies pain    Home Living Family/patient expects to be discharged to:: Private residence Living Arrangements: Spouse/significant other;Children Available Help at Discharge: Family;Available 24 hours/day Type of Home: House Home Access: Stairs to enter   [employer] of Steps: 5 Home Layout: Two level Home Equipment: None      Prior Function Level of Independence: Independent               Hand Dominance        Extremity/Trunk Assessment   Upper Extremity Assessment: Overall WFL for tasks assessed           Lower Extremity Assessment: Overall WFL for tasks assessed      Cervical / Trunk Assessment: Normal  Communication   Communication: No difficulties  Cognition Arousal/Alertness: Awake/alert Behavior During Therapy: WFL for tasks assessed/performed Overall Cognitive Status: Within Functional Limits for tasks assessed  General Comments      Exercises        Assessment/Plan    PT Assessment All further PT needs can be met in the next venue of care  PT Diagnosis Abnormality of gait   PT Problem List Decreased activity tolerance;Decreased balance;Decreased mobility  PT Treatment Interventions     PT Goals (Current goals can be  found in the Care Plan section) Acute Rehab PT Goals PT Goal Formulation: No goals set, d/c therapy    Frequency     Barriers to discharge        Co-evaluation               End of Session   Activity Tolerance: Patient tolerated treatment well Patient left: in chair;with call bell/phone within reach Nurse Communication: Mobility status;Precautions    Functional Assessment Tool Used: clinical judgement Functional Limitation: Mobility: Walking and moving around Mobility: Walking and Moving Around Current Status ([id number]): At least 1 percent but less than 20 percent impaired, limited or restricted Mobility: Walking and Moving Around Goal Status [phone]): At least 1 percent but less than 20 percent impaired, limited or restricted Mobility: Walking and Moving Around Discharge Status [phone]): At least 1 percent but less than 20 percent impaired, limited or restricted    Time: 0738-0758 PT Time Calculation (min): 20 min   Charges:   PT Evaluation $Initial PT Evaluation Tier I: 1 Procedure PT Treatments $Therapeutic Activity: 8-22 mins   PT G Codes:   Functional Assessment Tool Used: clinical judgement Functional Limitation: Mobility: Walking and moving around    Melford Aase 01/09/2014, 8:45 AM Elwyn Reach, [place]

## 2014-01-09 NOTE — Discharge Summary (Signed)
Physician Discharge Summary  Patient ID: Eddie Alexander MRN: [id number] DOB/AGE: 09/05/1960 53 y.o.  Admit date: 01/08/2014 Discharge date: 01/09/2014  Admission Diagnoses: C5-6 and C6-7 spondylosis, stenosis, cervical myelopathy, cervicalgia, cervical radiculopathy  Discharge Diagnoses: The same Active Problems:   Cervical spondylosis with myelopathy and radiculopathy   Discharged Condition: good  Hospital Course: I performed a C5-6 and C6-7 anterior cervical discectomy, fusion, and plating on 01/08/2014. The surgery went well.  The patient's postoperative course was unremarkable. On postoperative day #1 the patient requested discharge to home. He was given oral and written discharge instructions. All his questions were answered.  Consults: None Significant Diagnostic Studies: None Treatments: C5-6 and C6-7 anterior cervical discectomy, fusion, and plating. Discharge Exam: Blood pressure 157/94, pulse 74, temperature 97.7 F (36.5 C), temperature source Oral, resp. rate 18, height 6' (1.829 m), weight 120.43 kg (265 lb 8 oz), SpO2 93.00%. The patient is alert and pleasant. He looks well. His dressing is clean and dry. There is no evidence of hematoma or shift. His strength is normal in all 4 extremities.  Disposition: Home  Discharge Instructions   Call MD for:  difficulty breathing, headache or visual disturbances    Complete by:  As directed      Call MD for:  extreme fatigue    Complete by:  As directed      Call MD for:  hives    Complete by:  As directed      Call MD for:  persistant dizziness or light-headedness    Complete by:  As directed      Call MD for:  persistant nausea and vomiting    Complete by:  As directed      Call MD for:  redness, tenderness, or signs of infection (pain, swelling, redness, odor or green/yellow discharge around incision site)    Complete by:  As directed      Call MD for:  severe uncontrolled pain    Complete by:  As directed      Call MD for:  temperature >100.4    Complete by:  As directed      Diet - low sodium heart healthy    Complete by:  As directed      Discharge instructions    Complete by:  As directed   Call [phone] for a followup appointment. Take a stool softener while you are using pain medications.     Driving Restrictions    Complete by:  As directed   Do not drive for 2 weeks.     Increase activity slowly    Complete by:  As directed      Lifting restrictions    Complete by:  As directed   Do not lift more than 5 pounds. No excessive bending or twisting.     May shower / Bathe    Complete by:  As directed   He may shower after the pain she is removed 3 days after surgery. Leave the incision alone.     Remove dressing in 48 hours    Complete by:  As directed   Your stitches are under the scan and will dissolve by themselves. The Steri-Strips will fall off after you take a few showers. Do not rub back or pick at the wound, Leave the wound alone.            Medication List    STOP taking these medications       etodolac 400 MG tablet  Commonly known as:  LODINE     HYDROcodone-acetaminophen 5-325 MG per tablet  Commonly known as:  NORCO/VICODIN      TAKE these medications       aspirin EC 81 MG tablet  Take 81 mg by mouth daily.     atorvastatin 20 MG tablet  Commonly known as:  LIPITOR  Take 20 mg by mouth daily.     carvedilol 12.5 MG tablet  Commonly known as:  COREG  Take 12.5 mg by mouth 2 (two) times daily with a meal.     clopidogrel 75 MG tablet  Commonly known as:  PLAVIX  Take 75 mg by mouth daily.     diazepam 5 MG tablet  Commonly known as:  VALIUM  Take 1 tablet (5 mg total) by mouth every 6 (six) hours as needed for muscle spasms.     DSS 100 MG Caps  Take 100 mg by mouth 2 (two) times daily.     Fish Oil 1200 MG Caps  Take 1,200 mg by mouth daily.     lisinopril 20 MG tablet  Commonly known as:  PRINIVIL,ZESTRIL  Take 20 mg by mouth daily.          SignedTressie Stalker D 01/09/2014, 7:30 AM

## 2014-01-09 NOTE — Discharge Instructions (Signed)
Cervical Fusion  The neck is the upper portion of your spine. The 7 bones in your neck are referred to as the cervical spine. Cervical fusion is a type of surgery that is done on the cervical spine to relieve pressure on the spinal cord or one or more nerve roots. There are two types of cervical fusion:  · Anterior cervical fusion. This surgery is done through the front (anterior) part of your neck. During the surgery the affected intervertebral disk is removed to take pressure off the nerves or spinal cord. The area where the disc was removed is filled with a bone graft that causes the vertebral bodies to grow together (fuse) over time.  · Posterior cervical fusion. This surgery is done through the back (posterior) of the neck. The surgery joins two or more neck vertebrae into one solid section of bone. Posterior cervical fusion is most commonly used to treat neck fractures and dislocations and to fix deformities in the curve of the neck.  LET YOUR HEALTH CARE PROVIDER KNOW ABOUT:  · Any allergies you have.  · All medicines you are taking, including vitamins, herbs, eyedrops, creams, and over-the-counter medicines.  · Previous problems you or members of your family have had with the use of anesthetics.  · Any blood disorders or blood clotting problems you have.  · Previous surgeries you have had.  · Medical conditions you have.  RISKS AND COMPLICATIONS  Generally, this is a safe procedure. However, as with any procedure, problems can occur. Possible problems include:   · Infection.    · Bleeding with possible need for blood transfusion.    · Injury to surrounding structures, including nerves.    · Leakage of cerebrospinal fluid.    · Blood clots.  · Temporary breathing difficulties after surgery.  · Extended hospital stay, especially with posterior cervical fusion.  BEFORE THE PROCEDURE  · Do not eat or drink for 6-8 hours before the procedure.    · Take medicines as directed by your surgeon. Ask your surgeon about  changing or stopping your regular medicines.    · You will be given antibiotic medicines to keep the infection rate down.    · The surgical cut (incision) site on your neck will be marked.    · Your neck will be cleaned to reduce the risk of infection.  PROCEDURE   The length of the procedure depends on what needs to be done. It usually takes 2 or more hours. For both procedures, you will be given medicine to make you sleep (general anesthetic). A breathing tube will be placed down your throat.   Anterior Cervical Fusion   · An incision will usually be made in a skin fold line at the front of your neck, in the area where the fusion will be placed.   · The neck muscles will be pushed aside.    · The surgeon will remove the affected, degenerated disk and bone spurs (decompression). This helps to take the pressure off the nerves and spinal cord.    · The area where the disk was removed is then filled with a plastic spacer implant, bone graft, or both. These implants and bone grafts take the place of the disk and keep the nerve passageway open and clear for the nerves and spinal cord.    · In most cases, the surgeon will put metal plates, pins, or screws (hardware) in the neck to help stabilize the surgical site and to keep the implants and bone grafts in place. The hardware reduces motion at the surgical   site, so the bones can grow together. This provides extra support to the neck.    Posterior Cervical Fusion   · An incision will be made through the back of the neck.    · Two or more neck vertebrae will be joined into one solid section of bone.    · Metal plates and pins or screws may be placed in the neck. These help stabilize the neck, providing extra support to help the bones to grow together more easily.  AFTER THE PROCEDURE  · You will stay in a recovery area until the anesthesia has worn off. Your blood pressure and pulse will be checked often.    · You may continue to receive fluids and medicines, such as  antibiotics, through the IV tube for several days after the surgery.    · You may need to wear a neck or back brace for several weeks after surgery, especially when up and out of bed.    · You may be given pain medicine while still in the recovery area. Some pain is normal, but if your pain gets worse, tell your surgeon or nurse.    · Be up and moving as soon as possible after surgery. Physical therapists will help you start walking.    · To prevent blood clots in your legs:  ¨ You may be given compression stockings to wear.    ¨ You may need to take medicine to prevent clots.  · You may be asked to do breathing exercises. This is to prevent a lung infection.    · Most people stay in the hospital for 1-3 days after this surgery.    Document Released: 11/07/2001 Document Revised: 05/23/2013 Document Reviewed: 11/17/2012  [employer]® Patient Information ©2015 [employer]. This information is not intended to replace advice given to you by your health care provider. Make sure you discuss any questions you have with your health care provider.

## 2014-01-10 ENCOUNTER — Encounter (HOSPITAL_COMMUNITY): Payer: Self-pay | Admitting: Neurosurgery

## 2014-09-23 NOTE — Discharge Summary (Signed)
PATIENT NAME:  Eddie Alexander, Eddie Alexander MR#:  [id number] DATE OF BIRTH:  [birth year]  DATE OF ADMISSION:  09/08/2011 DATE OF DISCHARGE:  09/09/2011  DISCHARGE DIAGNOSES:  1. Suspected presyncope now resolved with negative serial cardiac enzymes. Could be due to sinus bradycardia/minimal hypotension. 2. Metoprolol reduced, feeling much better.  3. Hypokalemia/hypomagnesemia, repleted.   SECONDARY DIAGNOSES:  1. Hypertension.  2. Hyperlipidemia. 3. Coronary artery disease status post stenting. 4. History of cardiac arrest.  CONSULTATIONS: None.   LABORATORY, DIAGNOSTIC AND RADIOLOGICAL DATA: Chest x-ray on 04/09 showed no acute cardiopulmonary disease.   HISTORY AND SHORT HOSPITAL COURSE: The patient is a 54 year old male with above-mentioned medical problems who was admitted for suspected presyncope. He was ruled out with four negative sets of cardiac enzymes. He had hypokalemia and hypomagnesemia which was repleted. While in the hospital his heart rate was felt to be somewhat slow along with blood pressure also being on the lower side and this metoprolol dose was cut back. He was feeling much better on 04/10. He did not have any more presyncopal symptoms and was discharged home in stable condition.   PERTINENT PHYSICAL EXAMINATION: VITAL SIGNS: On the date of discharge vital signs are as follows: Temperature 97.6, heart rate 72 per minute, respirations 20 per minute, blood pressure 134/91, saturating 95% on room air. CARDIOVASCULAR: S1, S2 normal. No murmur, rubs, or gallop. LUNGS: Clear to auscultation bilaterally. No rales, rhonchi, or crepitation. ABDOMEN: Soft, benign. NEUROLOGIC: Nonfocal examination. All of his examination remained at baseline.   DISCHARGE MEDICATIONS:  1. Lipitor 80 mg p.o. daily. 2. Lisinopril 20 mg p.o. daily.  3. Aspirin 81 mg p.o. daily.  4. Plavix 75 mg p.o. daily.  5. Metoprolol 25 mg p.o. daily.   DISCHARGE DIET: Low sodium.   DISCHARGE ACTIVITY: As tolerated.    DISCHARGE INSTRUCTIONS AND FOLLOW-UP: The patient was instructed to follow up with his primary care physician, Dr. Vonita MossMark Crissman, in 1 to 2 weeks and then follow-up with Dr. Bernette RedbirdKevin Harrison in 2 to 3 weeks as scheduled.   TOTAL TIME DISCHARGING THIS PATIENT: 45 minutes.  ____________________________ Ellamae SiaVipul S. Sherryll BurgerShah, MD vss:rbg D: 09/13/2011 10:39:27 ET T: 09/14/2011 15:32:05 ET JOB#: [id number]  cc: Kenzlie Disch S. Sherryll BurgerShah, MD, <Dictator> Steele SizerMark A. Crissman, MD Bernette RedbirdKevin Harrison, MD Ellamae SiaVIPUL S [facility] MD ELECTRONICALLY SIGNED 09/14/2011 22:16

## 2014-09-23 NOTE — H&P (Signed)
PATIENT NAME:  Eddie Alexander, [name] MR#:  [id number] DATE OF BIRTH:  1961-03-11  DATE OF ADMISSION:  09/08/2011  PRIMARY CARE PHYSICIAN: Dr. Vonita MossMark Crissman  CHIEF COMPLAINT: Presyncope.   HISTORY OF PRESENT ILLNESS: A 54 year old male who presents to the Emergency Room from his work due to feeling dizzy, lightheaded and having a presyncopal episode. Patient says that he woke up this morning around 9:00, felt a little bit dizzy and lightheaded and not like himself. He drank some water, he felt a little bit better. He did end up going to work and at work patient started to not feel right again. About two years ago patient had somewhat similar symptoms but also had shortness of breath and some anginal symptoms and was noted to be in cardiac arrest. He was a bit concerned and therefore came to the ER for further evaluation. Presently patient feels comfortable and fine. He denies any chest pain, shortness of breath, nausea, vomiting, fevers, chills, cough, headache, dizziness or any other associated symptoms presently. His orthostatics were normal when checked by the Emergency Room. Hospitalist service was then contacted for further treatment and evaluation.   REVIEW OF SYSTEMS: CONSTITUTIONAL: No documented fever. No weight gain. No weight loss. EYES: No blurred or double vision. ENT: No tinnitus. No postnasal drip. No redness of the oropharynx. RESPIRATORY: No cough, no wheeze, no hemoptysis. CARDIOVASCULAR: No chest pain, no orthopnea, no palpitations. Positive presyncope. GASTROINTESTINAL: No nausea, no vomiting, no diarrhea, no abdominal pain, no melena, no hematochezia. GENITOURINARY: No dysuria. No hematuria. ENDOCRINE: No polyuria or nocturia. No heat or cold intolerance. HEME: No anemia, no bruising, no bleeding. INTEGUMENTARY: No rashes. No lesions. MUSCULOSKELETAL: No arthritis, no swelling, no gout. NEUROLOGIC: No numbness, no tingling, no ataxia, no seizure-type activity. Positive presyncope. PSYCH: No  anxiety, no insomnia, no ADD.   PAST MEDICAL HISTORY:  1. Hypertension.  2. Hyperlipidemia.  3. History of coronary artery disease, status post stent placement. 4. History of cardiac arrest.   ALLERGIES: No known drug allergies.   SOCIAL HISTORY: No smoking. No alcohol abuse. No illicit drug abuse. Lives at home with his wife.   FAMILY HISTORY: Mother died from a myocardial infarction at age 56. Father is alive and healthy at age 10175.   CURRENT MEDICATIONS:  1. Aspirin 81 mg daily.  2. Plavix 75 mg daily.  3. Lipitor 80 mg daily.  4. Lisinopril 20 mg daily.  5. Toprol 50 mg daily.   PHYSICAL EXAMINATION ON ADMISSION:  VITAL SIGNS: Patient's vital signs are noted to be: Temperature 97.9, pulse 74, respirations 18, blood pressure 150/93, saturations 98% on room air.   GENERAL: He is a pleasant-appearing male, no apparent distress.   HEENT: He is atraumatic, normocephalic. His extraocular muscles are intact. Pupils equal, reactive to light. Sclerae anicteric. No conjunctival injection. No oropharyngeal erythema.   NECK: Supple. There is no jugular venous distention. No bruits. No lymphadenopathy. No thyromegaly.   HEART: Regular rate and rhythm. No murmurs, no rubs, no clicks.   LUNGS: Clear to auscultation bilaterally. No rales, no rhonchi, no wheezes.   ABDOMEN: Soft, flat, nontender, nondistended. Has good bowel sounds. No hepatosplenomegaly appreciated.   EXTREMITIES: No evidence of any cyanosis, clubbing, or peripheral edema. Has +2 pedal and radial pulses bilaterally.   NEUROLOGICAL: Patient is alert, awake, oriented x3 with no focal motor or sensory deficits appreciated bilaterally.   SKIN: Moist, warm with no rash appreciated.   LYMPHATIC: There is no cervical or axillary adenopathy.  LABORATORY, DIAGNOSTIC, AND RADIOLOGICAL DATA: Serum glucose 139, BUN 14, creatinine 0.9, sodium 139, potassium 3.1, chloride 104, bicarbonate 27, magnesium 1.6. LFTs are within normal  limits. Troponin less than 0.02. White cell count 8.1, hemoglobin 14.2, hematocrit 42.5, platelet count 181.   ASSESSMENT AND PLAN: This is a 54 year old male with history of coronary artery disease, status post cardiac arrest and stent placement in 2011, hypertension, hyperlipidemia presents to the hospital with a presyncopal episode.  1. Presyncope. The exact etiology is unclear. Given his cardiac history and history of cardiac arrest suspicious for cardiogenic syncope. Therefore will observe him overnight on telemetry. Follow serial cardiac markers. Continue aspirin, Plavix, statin, beta blocker. Patient's orthostatics were checked in the Emergency Room and they are normal.  2. Hypertension. Continue metoprolol, lisinopril.  3. Hyperlipidemia. Continue Lipitor.  4. History of coronary artery disease, status post stent placement. Patient currently with no active chest pain. EKG does not show any acute ST or T wave changes. Will continue his aspirin, Plavix, statin, beta blocker.  5. Hypokalemia and hypomagnesemia. I will go ahead and replace his potassium and magnesium and repeat them in the morning.  6. CODE STATUS: Patient is a FULL CODE.   TIME SPENT WITH THE ADMISSION: 45 minutes.  ____________________________ Rolly Pancake. Cherlynn Kaiser, MD vjs:cms D: 09/08/2011 13:14:14 ET T: 09/08/2011 13:36:13 ET JOB#: [id number]  cc: Rolly Pancake. Cherlynn Kaiser, MD, <Dictator> Steele Sizer, MD [place] Siren MD ELECTRONICALLY SIGNED 09/08/2011 14:33

## 2015-07-18 IMAGING — CR DG CERVICAL SPINE 2 OR 3 VIEWS
2 series · 2 of 2 positions shown · non-contrast
Comparison: None.

CLINICAL DATA: Anterior cervical decompression with discectomy and
fusion at 2 levels.

EXAM:
CERVICAL SPINE - 2-3 VIEW

[AP]
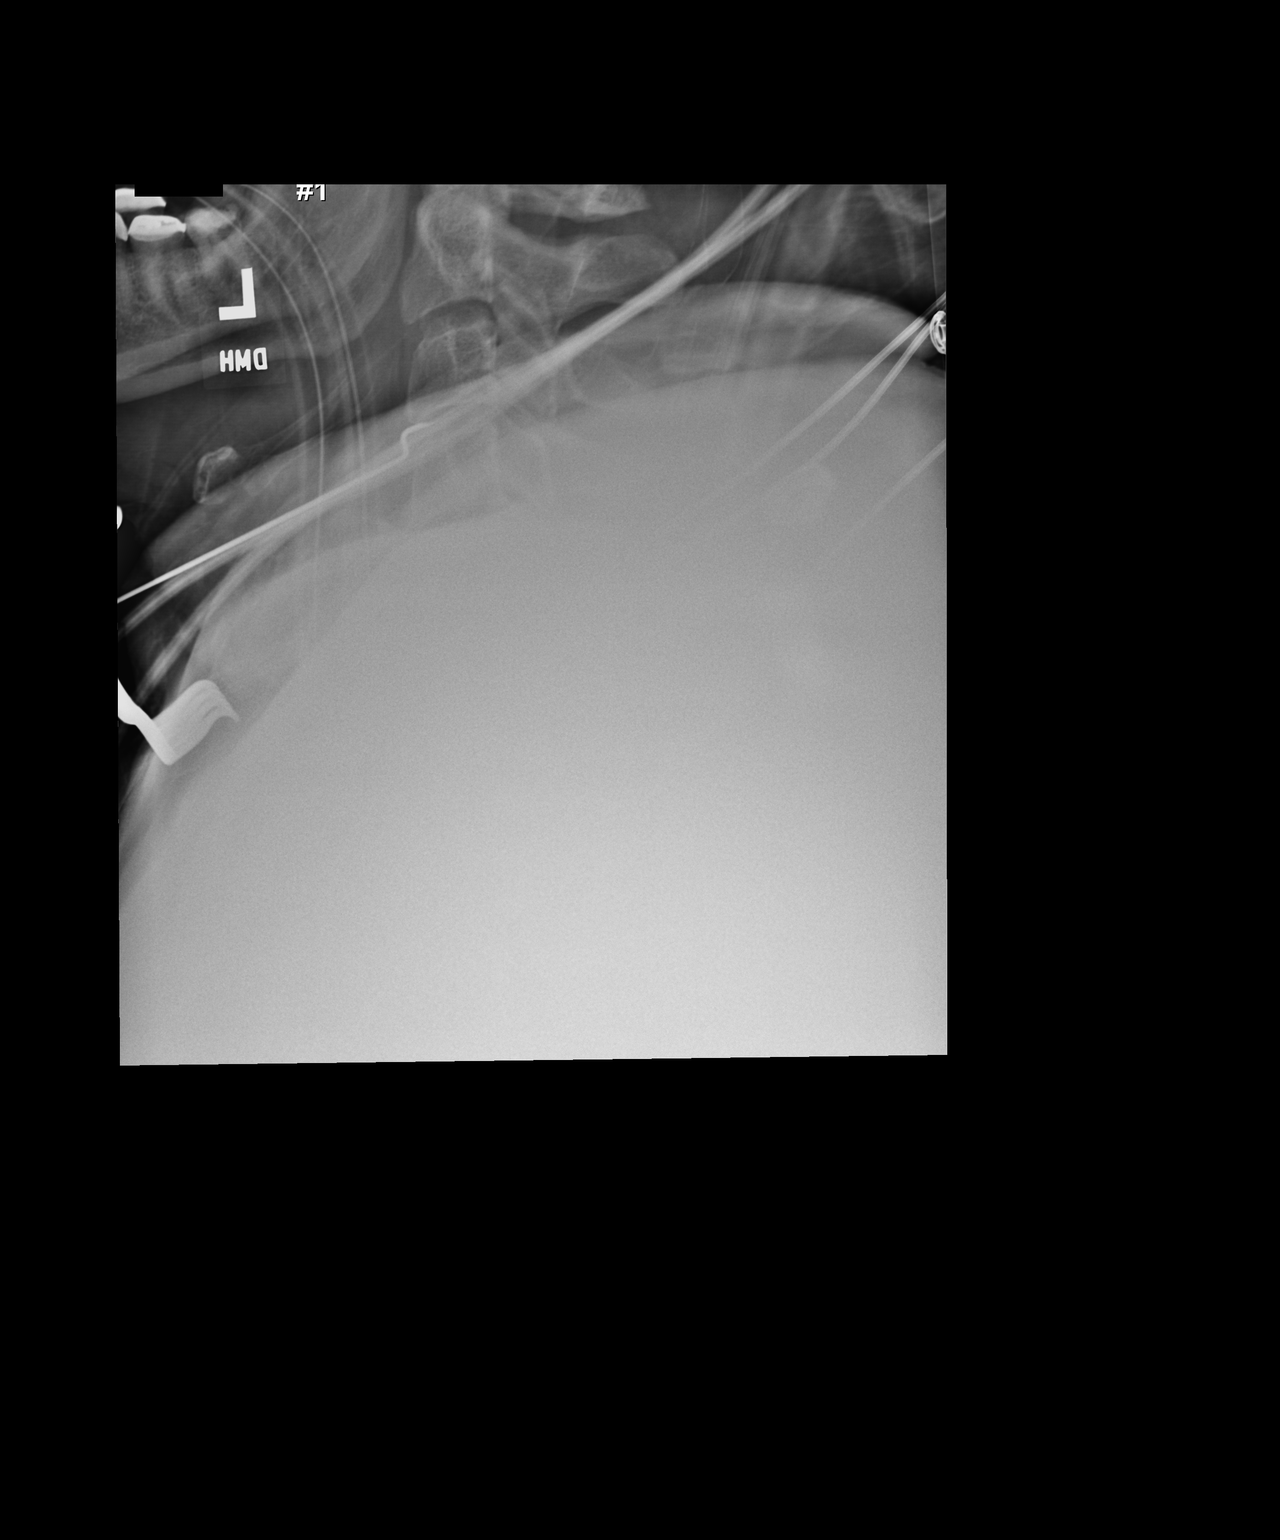

[lateral]
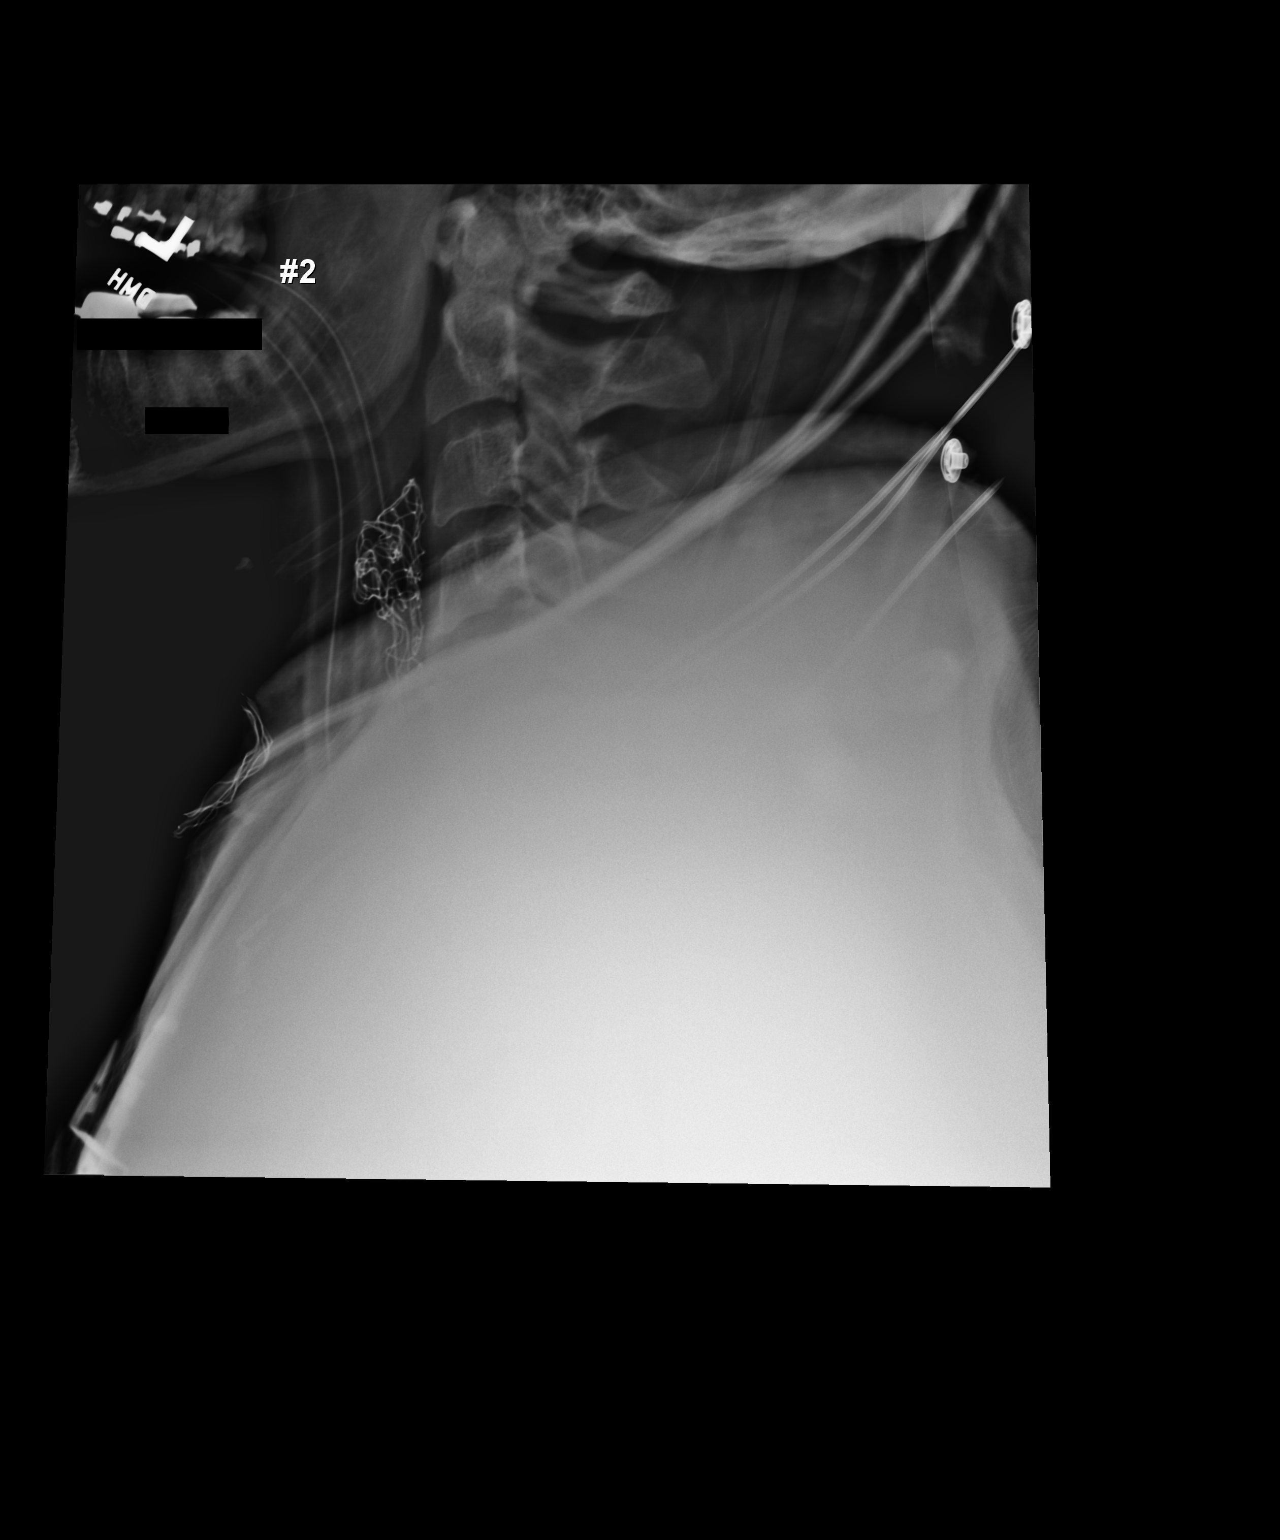

[2 of 2 positions shown; findings below may reference images not displayed]

FINDINGS: Initial image labeled #1 at 5200 hr demonstrates patient intubation
and a lateral view of the cervical spine with a sharp tip probe at
the C3-4 intervertebral level.

The next image, labeled #2, demonstrates surgical sponge in place
anterior to C3, C4, and C5. If there is another probe in place, I do
not visualize it.
IMPRESSION: 1. The first image demonstrates probe localization of C3-4. The
second image demonstrates sponge material in place anterior to C3,
C4, and C5, but I do not see another definite probe. The cervical
spine from C5 down is obscured by the patient's shoulders.

## 2015-11-18 DIAGNOSIS — I2510 Atherosclerotic heart disease of native coronary artery without angina pectoris: Secondary | ICD-10-CM | POA: Insufficient documentation

## 2015-11-18 DIAGNOSIS — E785 Hyperlipidemia, unspecified: Secondary | ICD-10-CM | POA: Insufficient documentation

## 2015-11-18 DIAGNOSIS — I10 Essential (primary) hypertension: Secondary | ICD-10-CM | POA: Insufficient documentation

## 2016-01-16 ENCOUNTER — Encounter: Payer: Self-pay | Admitting: Family Medicine

## 2016-01-24 ENCOUNTER — Encounter: Payer: Self-pay | Admitting: Family Medicine

## 2016-01-24 ENCOUNTER — Ambulatory Visit (INDEPENDENT_AMBULATORY_CARE_PROVIDER_SITE_OTHER): Payer: BLUE CROSS/BLUE SHIELD | Admitting: Family Medicine

## 2016-01-24 VITALS — BP 150/98 | HR 75 | Temp 98.8°F | Ht 71.4 in | Wt 271.0 lb

## 2016-01-24 DIAGNOSIS — I2510 Atherosclerotic heart disease of native coronary artery without angina pectoris: Secondary | ICD-10-CM | POA: Diagnosis not present

## 2016-01-24 DIAGNOSIS — I10 Essential (primary) hypertension: Secondary | ICD-10-CM | POA: Diagnosis not present

## 2016-01-24 DIAGNOSIS — M4712 Other spondylosis with myelopathy, cervical region: Secondary | ICD-10-CM | POA: Diagnosis not present

## 2016-01-24 DIAGNOSIS — E785 Hyperlipidemia, unspecified: Secondary | ICD-10-CM

## 2016-01-24 DIAGNOSIS — Z125 Encounter for screening for malignant neoplasm of prostate: Secondary | ICD-10-CM

## 2016-01-24 DIAGNOSIS — E669 Obesity, unspecified: Secondary | ICD-10-CM

## 2016-01-24 DIAGNOSIS — M4722 Other spondylosis with radiculopathy, cervical region: Secondary | ICD-10-CM

## 2016-01-24 DIAGNOSIS — R7301 Impaired fasting glucose: Secondary | ICD-10-CM

## 2016-01-24 LAB — MICROALBUMIN, URINE WAIVED
CREATININE, URINE WAIVED: 100 mg/dL (ref 10–300)
MICROALB, UR WAIVED: 30 mg/L — AB (ref 0–19)
Microalb/Creat Ratio: <30 mg/g (ref <30)

## 2016-01-24 LAB — LIPID PANEL PICCOLO, WAIVED
Chol/HDL Ratio Piccolo,Waive: 2.4 mg/dL
Cholesterol Piccolo, Waived: 108 mg/dL (ref <200)
HDL Chol Piccolo, Waived: 45 mg/dL — ABNORMAL LOW (ref >59)
LDL Chol Calc Piccolo Waived: 43 mg/dL (ref <100)
TRIGLYCERIDES PICCOLO,WAIVED: 99 mg/dL (ref <150)
VLDL CHOL CALC PICCOLO,WAIVE: 20 mg/dL (ref <30)

## 2016-01-24 LAB — UA/M W/RFLX CULTURE, ROUTINE
Bilirubin, UA: POSITIVE — AB
GLUCOSE, UA: NEGATIVE
Ketones, UA: NEGATIVE
LEUKOCYTES UA: NEGATIVE
Nitrite, UA: NEGATIVE
PH UA: 7 (ref 5.0–7.5)
PROTEIN UA: NEGATIVE
RBC UA: NEGATIVE
SPEC GRAV UA: 1.02 (ref 1.005–1.030)
Urobilinogen, Ur: 0.2 mg/dL (ref 0.2–1.0)

## 2016-01-24 LAB — BAYER DCA HB A1C WAIVED: HB A1C (BAYER DCA - WAIVED): 6 % (ref <7.0)

## 2016-01-24 MED ORDER — HYDROCHLOROTHIAZIDE 25 MG PO TABS: 25.0000 mg | ORAL_TABLET | Freq: Every day | ORAL | 1 refills | Status: DC

## 2016-01-24 MED ORDER — LISINOPRIL 40 MG PO TABS: 40.0000 mg | ORAL_TABLET | Freq: Every day | ORAL | 1 refills | Status: DC

## 2016-01-24 NOTE — Assessment & Plan Note (Addendum)
Under good control on current regimen. Concerned that he has a lot of myalgias from the atorvastatin. Will stop it for 1 week. If not 100% better, will go back on it.

## 2016-01-24 NOTE — Assessment & Plan Note (Signed)
Stable. No pain. Continue to follow with cardiology. Call with any concerns.  

## 2016-01-24 NOTE — Assessment & Plan Note (Signed)
Continue to follow with neurosurgery as needed.

## 2016-01-24 NOTE — Assessment & Plan Note (Signed)
Not under good control. Will add HCTZ- check back in in 1 month.

## 2016-01-24 NOTE — Assessment & Plan Note (Signed)
Will work on diet and exercise. Continue to monitor.

## 2016-01-24 NOTE — Progress Notes (Signed)
BP (!) 150/98 (BP Location: Left Arm, Patient Position: Sitting, Cuff Size: Large)   Pulse 75   Temp 98.8 F (37.1 C)   Ht 5' 11.4" (1.814 m)   Wt 271 lb (122.9 kg)   SpO2 98%   BMI 37.37 kg/m    Subjective:    Patient ID: Eddie Alexander, male    DOB: [birth year], 55 y.o.   MRN: [id number]  HPI: Eddie Alexander is a 55 y.o. male  Chief Complaint  Patient presents with  . Establish Care   Follows with cardiology through [facility]. BP elevated last time he was there. Stable CAD.   Doing well with his neck. Now with spinal stenosis in his lumbar- going to have some surgery.   HYPERTENSION / HYPERLIPIDEMIA Satisfied with current treatment? yes Duration of hypertension: chronic BP monitoring frequency: not checking BP medication side effects: no Duration of hyperlipidemia: chronic Cholesterol medication side effects: yes Cholesterol supplements: fish oil Medication compliance: excellent compliance Aspirin: no Recent stressors: no Recurrent headaches: no Visual changes: no Palpitations: no Dyspnea: no Chest pain: no Lower extremity edema: no Dizzy/lightheaded: no   Active Ambulatory Problems    Diagnosis Date Noted  . Cervical spondylosis with myelopathy and radiculopathy 01/08/2014  . Hypertension   . Hyperlipidemia   . CAD (coronary artery disease)   . IFG (impaired fasting glucose) 01/24/2016   Resolved Ambulatory Problems    Diagnosis Date Noted  . No Resolved Ambulatory Problems   Past Medical History:  Diagnosis Date  . Back pain   . CAD (coronary artery disease)   . Cervical spondylosis with myelopathy   . GERD (gastroesophageal reflux disease)   . H/O urinary frequency   . Hyperlipidemia   . Hypertension   . Myocardial infarction [facility]) 2011   No Known Allergies Past Surgical History:  Procedure Laterality Date  . ANTERIOR CERVICAL DECOMP/DISCECTOMY FUSION N/A 01/08/2014   Procedure: ANTERIOR CERVICAL DECOMPRESSION/DISCECTOMY FUSION 2 LEVELS;   Surgeon: Tressie StalkerJeffrey Jenkins, MD;  Location: [facility];  Service: Neurosurgery;  Laterality: N/A;  Cervical Five-Six/Six-Seven Anterior Cervical Decompression with Fusoin interbody prosthesis plating and bonegraft  . CORONARY ANGIOPLASTY  [id number]   [facility]  . TONSILLECTOMY     Social History   Social History  . Marital status: Married    Spouse name: N/A  . Number of children: N/A  . Years of education: N/A   Occupational History  . Not on file.   Social History Main Topics  . Smoking status: Former Smoker    Types: Cigars    Quit date: 01/01/2013  . Smokeless tobacco: Never Used  . Alcohol use 1.8 oz/week    3 Shots of liquor per week  . Drug use: No  . Sexual activity: Yes   Other Topics Concern  . Not on file   Social History Narrative  . No narrative on file   Family History  Problem Relation Age of Onset  . Heart disease Mother   . Heart disease Maternal Grandfather   . Heart disease Paternal Grandmother   . Heart disease Paternal Grandfather    Review of Systems  Constitutional: Negative.   Respiratory: Negative.   Cardiovascular: Negative.   Musculoskeletal: Positive for back pain and myalgias. Negative for arthralgias, gait problem, joint swelling, neck pain and neck stiffness.  Psychiatric/Behavioral: Negative.     Per HPI unless specifically indicated above     Objective:    BP (!) 150/98 (BP Location: Left Arm,  Patient Position: Sitting, Cuff Size: Large)   Pulse 75   Temp 98.8 F (37.1 C)   Ht 5' 11.4" (1.814 m)   Wt 271 lb (122.9 kg)   SpO2 98%   BMI 37.37 kg/m   Wt Readings from Last 3 Encounters:  01/24/16 271 lb (122.9 kg)  01/08/14 265 lb 8 oz (120.4 kg)  01/01/14 265 lb 8 oz (120.4 kg)    Physical Exam  Constitutional: He is oriented to person, place, and time. He appears well-developed and well-nourished. No distress.  HENT:  Head: Normocephalic and atraumatic.  Right Ear: Hearing normal.  Left Ear: Hearing  normal.  Nose: Nose normal.  Eyes: Conjunctivae and lids are normal. Right eye exhibits no discharge. Left eye exhibits no discharge. No scleral icterus.  Cardiovascular: Normal rate, regular rhythm, normal heart sounds and intact distal pulses.  Exam reveals no gallop and no friction rub.   No murmur heard. Pulmonary/Chest: Effort normal and breath sounds normal. No respiratory distress. He has no wheezes. He has no rales. He exhibits no tenderness.  Musculoskeletal: Normal range of motion.  Neurological: He is alert and oriented to person, place, and time.  Skin: Skin is warm, dry and intact. No rash noted. No erythema. No pallor.  Psychiatric: He has a normal mood and affect. His speech is normal and behavior is normal. Judgment and thought content normal. Cognition and memory are normal.  Vitals reviewed.   Results for orders placed or performed during the hospital encounter of 01/01/14  Surgical pcr screen  Result Value Ref Range   MRSA, PCR NEGATIVE NEGATIVE   Staphylococcus aureus NEGATIVE NEGATIVE  Basic metabolic panel  Result Value Ref Range   Sodium 140 137 - 147 mEq/L   Potassium 4.8 3.7 - 5.3 mEq/L   Chloride 105 96 - 112 mEq/L   CO2 23 19 - 32 mEq/L   Glucose, Bld 105 (H) 70 - 99 mg/dL   BUN 12 6 - 23 mg/dL   Creatinine, Ser 1.61 0.50 - 1.35 mg/dL   Calcium 8.8 8.4 - 09.6 mg/dL   GFR calc non Af Amer >90 >90 mL/min   GFR calc Af Amer >90 >90 mL/min   Anion gap 12 5 - 15  CBC  Result Value Ref Range   WBC 7.3 4.0 - 10.5 K/uL   RBC 4.38 4.22 - 5.81 MIL/uL   Hemoglobin 13.6 13.0 - 17.0 g/dL   HCT 04.5 (L) 40.9 - 81.1 %   MCV 87.2 78.0 - 100.0 fL   MCH 31.1 26.0 - 34.0 pg   MCHC 35.6 30.0 - 36.0 g/dL   RDW 91.4 78.2 - 95.6 %   Platelets 162 150 - 400 K/uL      Assessment & Plan:   Problem List Items Addressed This Visit      Cardiovascular and Mediastinum   Hypertension - Primary    Not under good control. Will add HCTZ- check back in in 1 month.        Relevant Medications   lisinopril (PRINIVIL,ZESTRIL) 40 MG tablet   hydrochlorothiazide (HYDRODIURIL) 25 MG tablet   Other Relevant Orders   Bayer DCA Hb A1c Waived   Comprehensive metabolic panel   Microalbumin, Urine Waived   TSH   UA/M w/rflx Culture, Routine   CAD (coronary artery disease)    Stable. No pain. Continue to follow with cardiology. Call with any concerns.       Relevant Medications   lisinopril (PRINIVIL,ZESTRIL) 40 MG tablet  hydrochlorothiazide (HYDRODIURIL) 25 MG tablet   Other Relevant Orders   CBC with Differential/Platelet   Comprehensive metabolic panel     Endocrine   IFG (impaired fasting glucose)    Will work on diet and exercise. Continue to monitor.         Nervous and Auditory   Cervical spondylosis with myelopathy and radiculopathy    Continue to follow with neurosurgery as needed.      Relevant Orders   Comprehensive metabolic panel     Other   Hyperlipidemia    Under good control on current regimen. Concerned that he has a lot of myalgias from the atorvastatin. Will stop it for 1 week. If not 100% better, will go back on it.       Relevant Medications   lisinopril (PRINIVIL,ZESTRIL) 40 MG tablet   hydrochlorothiazide (HYDRODIURIL) 25 MG tablet   Other Relevant Orders   Bayer DCA Hb A1c Waived   Comprehensive metabolic panel   Lipid Panel Piccolo, Waived    Other Visit Diagnoses    Screening for prostate cancer       Labs drawn today. Await results.    Relevant Orders   PSA   Obesity       Work on diet and exercise. Continue to monitor.    Relevant Orders   Bayer DCA Hb A1c Waived       Follow up plan: Return in about 4 weeks (around 02/21/2016) for BP follow up.

## 2016-01-25 LAB — COMPREHENSIVE METABOLIC PANEL
ALT: 17 IU/L (ref 0–44)
AST: 14 IU/L (ref 0–40)
Albumin/Globulin Ratio: 1.6 (ref 1.2–2.2)
Albumin: 4 g/dL (ref 3.5–5.5)
Alkaline Phosphatase: 79 IU/L (ref 39–117)
BILIRUBIN TOTAL: 1.2 mg/dL (ref 0.0–1.2)
BUN/Creatinine Ratio: 14 (ref 9–20)
BUN: 15 mg/dL (ref 6–24)
CHLORIDE: 100 mmol/L (ref 96–106)
CO2: 22 mmol/L (ref 18–29)
Calcium: 8.9 mg/dL (ref 8.7–10.2)
Creatinine, Ser: 1.04 mg/dL (ref 0.76–1.27)
GFR calc non Af Amer: 80 mL/min/{1.73_m2} (ref >59)
GFR, EST AFRICAN AMERICAN: 92 mL/min/{1.73_m2} (ref >59)
GLUCOSE: 137 mg/dL — AB (ref 65–99)
Globulin, Total: 2.5 g/dL (ref 1.5–4.5)
Potassium: 4.1 mmol/L (ref 3.5–5.2)
Sodium: 139 mmol/L (ref 134–144)
Total Protein: 6.5 g/dL (ref 6.0–8.5)

## 2016-01-25 LAB — CBC WITH DIFFERENTIAL/PLATELET
BASOS ABS: 0 10*3/uL (ref 0.0–0.2)
Basos: 0 %
EOS (ABSOLUTE): 0.1 10*3/uL (ref 0.0–0.4)
Eos: 3 %
Hematocrit: 40.8 % (ref 37.5–51.0)
Hemoglobin: 13.7 g/dL (ref 12.6–17.7)
Immature Grans (Abs): 0 10*3/uL (ref 0.0–0.1)
Immature Granulocytes: 0 %
Lymphocytes Absolute: 1.2 10*3/uL (ref 0.7–3.1)
Lymphs: 24 %
MCH: 30 pg (ref 26.6–33.0)
MCHC: 33.6 g/dL (ref 31.5–35.7)
MCV: 89 fL (ref 79–97)
Monocytes Absolute: 0.4 10*3/uL (ref 0.1–0.9)
Monocytes: 8 %
NEUTROS ABS: 3.2 10*3/uL (ref 1.4–7.0)
Neutrophils: 65 %
PLATELETS: 151 10*3/uL (ref 150–379)
RBC: 4.57 x10E6/uL (ref 4.14–5.80)
RDW: 13.7 % (ref 12.3–15.4)
WBC: 4.9 10*3/uL (ref 3.4–10.8)

## 2016-01-25 LAB — TSH: TSH: 1.32 u[IU]/mL (ref 0.450–4.500)

## 2016-01-25 LAB — PSA: PROSTATE SPECIFIC AG, SERUM: 0.5 ng/mL (ref 0.0–4.0)

## 2016-01-27 ENCOUNTER — Encounter: Payer: Self-pay | Admitting: Family Medicine

## 2016-02-06 ENCOUNTER — Telehealth: Payer: Self-pay | Admitting: Family Medicine

## 2016-02-06 NOTE — Telephone Encounter (Signed)
Called to check on his myalgias. He was a little better, but not significantly. Will go back on his statin. Recheck on myalgias at follow up.

## 2016-02-06 NOTE — Telephone Encounter (Signed)
-----   Message from Dorcas CarrowMegan P Johnson, [place] sent at 01/24/2016  9:14 AM EDT -----   ----- Message ----- From: Dorcas CarrowMegan P Johnson, DO Sent: 01/24/2016   8:57 AM To: Dorcas CarrowMegan P Johnson, DO  Call to check on myalgias off statin.

## 2016-02-21 ENCOUNTER — Ambulatory Visit: Payer: BLUE CROSS/BLUE SHIELD | Admitting: Family Medicine

## 2016-03-01 ENCOUNTER — Other Ambulatory Visit: Payer: Self-pay | Admitting: Family Medicine

## 2016-03-27 ENCOUNTER — Ambulatory Visit (INDEPENDENT_AMBULATORY_CARE_PROVIDER_SITE_OTHER): Payer: BLUE CROSS/BLUE SHIELD | Admitting: Family Medicine

## 2016-03-27 ENCOUNTER — Other Ambulatory Visit: Payer: Self-pay | Admitting: Family Medicine

## 2016-03-27 ENCOUNTER — Encounter: Payer: Self-pay | Admitting: Family Medicine

## 2016-03-27 VITALS — BP 138/87 | HR 73 | Temp 98.3°F | Wt 273.4 lb

## 2016-03-27 DIAGNOSIS — IMO0001 Reserved for inherently not codable concepts without codable children: Secondary | ICD-10-CM

## 2016-03-27 DIAGNOSIS — E6609 Other obesity due to excess calories: Secondary | ICD-10-CM

## 2016-03-27 DIAGNOSIS — Z6837 Body mass index (BMI) 37.0-37.9, adult: Secondary | ICD-10-CM

## 2016-03-27 DIAGNOSIS — I10 Essential (primary) hypertension: Secondary | ICD-10-CM

## 2016-03-27 MED ORDER — LORCASERIN HCL ER 20 MG PO TB24: 1.0000 | ORAL_TABLET | Freq: Every day | ORAL | 2 refills | Status: DC

## 2016-03-27 NOTE — Assessment & Plan Note (Signed)
Would like to start belviq. Risks and benefits discussed. Recheck 1 month for tolerance.

## 2016-03-27 NOTE — Assessment & Plan Note (Signed)
Better. Under good control. Continue current regimen. Continue to monitor. BMP checked today.

## 2016-03-27 NOTE — Patient Instructions (Addendum)
Lorcaserin oral tablets What is this medicine? LORCASERIN (lor ca SER in) is used to promote and maintain weight loss in obese patients. This medicine should be used with a reduced calorie diet and, if appropriate, an exercise program. This medicine may be used for other purposes; ask your health care provider or pharmacist if you have questions. What should I tell my health care provider before I take this medicine? They need to know if you have any of these conditions: -anatomical deformation of the penis, Peyronie's disease, or history of priapism (painful and prolonged erection) -diabetes -heart disease -history of blood diseases, like sickle cell anemia or leukemia -history of irregular heartbeat -kidney disease -liver disease -suicidal thoughts, plans, or attempt; a previous suicide attempt by you or a family member -an unusual or allergic reaction to lorcaserin, other medicines, foods, dyes, or preservatives -pregnant or trying to get pregnant -breast-feeding How should I use this medicine? Take this medicine by mouth with a glass of water. Follow the directions on the prescription label. You can take it with or without food. Take your medicine at regular intervals. Do not take it more often than directed. Do not stop taking except on your doctor's advice. Talk to your pediatrician regarding the use of this medicine in children. Special care may be needed. Overdosage: If you think you have taken too much of this medicine contact a poison control center or emergency room at once. NOTE: This medicine is only for you. Do not share this medicine with others. What if I miss a dose? If you miss a dose, take it as soon as you can. If it is almost time for your next dose, take only that dose. Do not take double or extra doses. What may interact with this medicine? -cabergoline -certain medicines for depression, anxiety, or psychotic disturbances -certain medicines for erectile  dysfunction -certain medicines for migraine headache like almotriptan, eletriptan, frovatriptan, naratriptan, rizatriptan, sumatriptan, zolmitriptan -dextromethorphan -linezolid -lithium -medicines for diabetes -other weight loss products -tramadol -St. John's Wort -stimulant medicines for attention disorders, weight loss, or to stay awake -tryptophan This list may not describe all possible interactions. Give your health care provider a list of all the medicines, herbs, non-prescription drugs, or dietary supplements you use. Also tell them if you smoke, drink alcohol, or use illegal drugs. Some items may interact with your medicine. What should I watch for while using this medicine? This medicine is intended to be used in addition to a healthy diet and appropriate exercise. The best results are achieved this way. Your doctor should instruct you to stop taking this medicine if you do not lose a certain amount of weight within the first 12 weeks of treatment, but it is important that you do not change your dose in any way without consulting your doctor or health care professional. Visit your doctor or health care professional for regular checkups. Your doctor may order blood tests or other tests to see how you are doing. Do not drive, use machinery, or do anything that needs mental alertness until you know how this medicine affects you. This medicine may affect blood sugar levels. If you have diabetes, check with your doctor or health care professional before you change your diet or the dose of your diabetic medicine. Patients and their families should watch out for worsening depression or thoughts of suicide. Also watch out for sudden changes in feelings such as feeling anxious, agitated, panicky, irritable, hostile, aggressive, impulsive, severely restless, overly excited and hyperactive, or   not being able to sleep. If this happens, especially at the beginning of treatment or after a change in dose,  call your health care professional. Contact your doctor or health care professional right away if you are a man with an erection that lasts longer than 4 hours or if the erection becomes painful. This may be a sign of serious problem and must be treated right away to prevent permanent damage. What side effects may I notice from receiving this medicine? Side effects that you should report to your doctor or health care professional as soon as possible: -allergic reactions like skin rash, itching or hives, swelling of the face, lips, or tongue -abnormal production of milk -breast enlargement in both males and females -breathing problems -changes in emotions or moods -changes in vision -confusion -erection lasting more than 4 hours or a painful erection -fast or irregular heart beat -feeling faint or lightheaded, falls -fever or chills, sore throat -hallucination, loss of contact with reality -high or low blood pressure -menstrual changes -restlessness -slow or irregular heartbeat -stiff muscles -sweating -suicidal thoughts or other mood changes -swelling of the ankles, feet, hands -unusually weak or tired -vomiting Side effects that usually do not require medical attention (Report these to your doctor or health care professional if they continue or are bothersome.): -back pain -constipation -cough -dry mouth -nausea -tiredness This list may not describe all possible side effects. Call your doctor for medical advice about side effects. You may report side effects to FDA at 1-800-FDA-1088. Where should I keep my medicine? Keep out of the reach of children. This medicine can be abused. Keep your medicine in a safe place to protect it from theft. Do not share this medicine with anyone. Selling or giving away this medicine is dangerous and against the law. Store at room temperature between 15 and 30 degrees C (59 and 86 degrees F). Throw away any unused medicine after the expiration  date. NOTE: This sheet is a summary. It may not cover all possible information. If you have questions about this medicine, talk to your doctor, pharmacist, or health care provider.    2016, Elsevier/Gold Standard. (2014-12-24 16:21:05)  

## 2016-03-27 NOTE — Progress Notes (Signed)
BP 138/87 (BP Location: Left Arm, Patient Position: Sitting, Cuff Size: Large)   Pulse 73   Temp 98.3 F (36.8 C)   Wt 273 lb 6.4 oz (124 kg)   SpO2 99%   BMI 37.71 kg/m    Subjective:    Patient ID: Eddie Alexander, male    DOB: March 26, 1961, 55 y.o.   MRN: [id number]  HPI: Eddie Alexander is a 55 y.o. male  Chief Complaint  Patient presents with  . Hypertension   HYPERTENSION Hypertension status: controlled  Satisfied with current treatment? yes Duration of hypertension: chronic BP monitoring frequency:  not checking BP medication side effects:  no Medication compliance: excellent compliance Aspirin: yes Recurrent headaches: no Visual changes: no Palpitations: no Dyspnea: no Chest pain: no Lower extremity edema: no Dizzy/lightheaded: no   WEIGHT GAIN Duration: chronic Previous attempts at weight loss: yes Complications of obesity: CAD, IFG, HTN, HLD Peak weight: current Weight loss goal: 30lbs Weight loss to date: none Requesting obesity pharmacotherapy: yes Current weight loss supplements/medications: no Previous weight loss supplements/meds: no  Relevant past medical, surgical, family and social history reviewed and updated as indicated. Interim medical history since our last visit reviewed. Allergies and medications reviewed and updated.  Review of Systems  Constitutional: Negative.   Respiratory: Negative.   Cardiovascular: Negative.   Psychiatric/Behavioral: Negative.     Per HPI unless specifically indicated above     Objective:    BP 138/87 (BP Location: Left Arm, Patient Position: Sitting, Cuff Size: Large)   Pulse 73   Temp 98.3 F (36.8 C)   Wt 273 lb 6.4 oz (124 kg)   SpO2 99%   BMI 37.71 kg/m   Wt Readings from Last 3 Encounters:  03/27/16 273 lb 6.4 oz (124 kg)  01/24/16 271 lb (122.9 kg)  01/08/14 265 lb 8 oz (120.4 kg)    Physical Exam  Constitutional: He is oriented to person, place, and time. He appears well-developed and  well-nourished. No distress.  HENT:  Head: Normocephalic and atraumatic.  Right Ear: Hearing normal.  Left Ear: Hearing normal.  Nose: Nose normal.  Eyes: Conjunctivae and lids are normal. Right eye exhibits no discharge. Left eye exhibits no discharge. No scleral icterus.  Cardiovascular: Normal rate, regular rhythm, normal heart sounds and intact distal pulses.  Exam reveals no gallop and no friction rub.   No murmur heard. Pulmonary/Chest: Effort normal and breath sounds normal. No respiratory distress. He has no wheezes. He has no rales. He exhibits no tenderness.  Musculoskeletal: Normal range of motion.  Neurological: He is alert and oriented to person, place, and time.  Skin: Skin is warm, dry and intact. No rash noted. No erythema. No pallor.  Psychiatric: He has a normal mood and affect. His speech is normal and behavior is normal. Judgment and thought content normal. Cognition and memory are normal.  Nursing note and vitals reviewed.   Results for orders placed or performed in visit on 01/24/16  Bayer DCA Hb A1c Waived  Result Value Ref Range   Bayer DCA Hb A1c Waived 6.0 <7.0 %  CBC with Differential/Platelet  Result Value Ref Range   WBC 4.9 3.4 - 10.8 x10E3/uL   RBC 4.57 4.14 - 5.80 x10E6/uL   Hemoglobin 13.7 12.6 - 17.7 g/dL   Hematocrit 40.940.8 81.137.5 - 51.0 %   MCV 89 79 - 97 fL   MCH 30.0 26.6 - 33.0 pg   MCHC 33.6 31.5 - 35.7 g/dL   RDW  13.7 12.3 - 15.4 %   Platelets 151 150 - 379 x10E3/uL   Neutrophils 65 %   Lymphs 24 %   Monocytes 8 %   Eos 3 %   Basos 0 %   Neutrophils Absolute 3.2 1.4 - 7.0 x10E3/uL   Lymphocytes Absolute 1.2 0.7 - 3.1 x10E3/uL   Monocytes Absolute 0.4 0.1 - 0.9 x10E3/uL   EOS (ABSOLUTE) 0.1 0.0 - 0.4 x10E3/uL   Basophils Absolute 0.0 0.0 - 0.2 x10E3/uL   Immature Granulocytes 0 %   Immature Grans (Abs) 0.0 0.0 - 0.1 x10E3/uL  Comprehensive metabolic panel  Result Value Ref Range   Glucose 137 (H) 65 - 99 mg/dL   BUN 15 6 - 24 mg/dL    Creatinine, Ser 1.61 0.76 - 1.27 mg/dL   GFR calc non Af Amer 80 >59 mL/min/1.73   GFR calc Af Amer 92 >59 mL/min/1.73   BUN/Creatinine Ratio 14 9 - 20   Sodium 139 134 - 144 mmol/L   Potassium 4.1 3.5 - 5.2 mmol/L   Chloride 100 96 - 106 mmol/L   CO2 22 18 - 29 mmol/L   Calcium 8.9 8.7 - 10.2 mg/dL   Total Protein 6.5 6.0 - 8.5 g/dL   Albumin 4.0 3.5 - 5.5 g/dL   Globulin, Total 2.5 1.5 - 4.5 g/dL   Albumin/Globulin Ratio 1.6 1.2 - 2.2   Bilirubin Total 1.2 0.0 - 1.2 mg/dL   Alkaline Phosphatase 79 39 - 117 IU/L   AST 14 0 - 40 IU/L   ALT 17 0 - 44 IU/L  Lipid Panel Piccolo, Waived  Result Value Ref Range   Cholesterol Piccolo, Waived 108 <200 mg/dL   HDL Chol Piccolo, Waived 45 (L) >59 mg/dL   Triglycerides Piccolo,Waived 99 <150 mg/dL   Chol/HDL Ratio Piccolo,Waive 2.4 mg/dL   LDL Chol Calc Piccolo Waived 43 <100 mg/dL   VLDL Chol Calc Piccolo,Waive 20 <30 mg/dL  Microalbumin, Urine Waived  Result Value Ref Range   Microalb, Ur Waived 30 (H) 0 - 19 mg/L   Creatinine, Urine Waived 100 10 - 300 mg/dL   Microalb/Creat Ratio <30 <30 mg/g  PSA  Result Value Ref Range   Prostate Specific Ag, Serum 0.5 0.0 - 4.0 ng/mL  TSH  Result Value Ref Range   TSH 1.320 0.450 - 4.500 uIU/mL  UA/M w/rflx Culture, Routine  Result Value Ref Range   Specific Gravity, UA 1.020 1.005 - 1.030   pH, UA 7.0 5.0 - 7.5   Color, UA Yellow Yellow   Appearance Ur Clear Clear   Leukocytes, UA Negative Negative   Protein, UA Negative Negative/Trace   Glucose, UA Negative Negative   Ketones, UA Negative Negative   RBC, UA Negative Negative   Bilirubin, UA Positive (A) Negative   Urobilinogen, Ur 0.2 0.2 - 1.0 mg/dL   Nitrite, UA Negative Negative      Assessment & Plan:   Problem List Items Addressed This Visit      Cardiovascular and Mediastinum   Hypertension - Primary    Better. Under good control. Continue current regimen. Continue to monitor. BMP checked today.        Other   Class  2 obesity due to excess calories with serious comorbidity and body mass index (BMI) of 37.0 to 37.9 in adult    Would like to start belviq. Risks and benefits discussed. Recheck 1 month for tolerance.       Relevant Medications   Lorcaserin HCl ER (BELVIQ  XR) 20 MG TB24    Other Visit Diagnoses   None.      Follow up plan: Return in about 4 weeks (around 04/24/2016) for follow up weight.

## 2016-03-28 LAB — BASIC METABOLIC PANEL
BUN/Creatinine Ratio: 13 (ref 9–20)
BUN: 13 mg/dL (ref 6–24)
CALCIUM: 9.4 mg/dL (ref 8.7–10.2)
CHLORIDE: 99 mmol/L (ref 96–106)
CO2: 22 mmol/L (ref 18–29)
Creatinine, Ser: 1.03 mg/dL (ref 0.76–1.27)
GFR calc Af Amer: 93 mL/min/{1.73_m2} (ref >59)
GFR calc non Af Amer: 80 mL/min/{1.73_m2} (ref >59)
GLUCOSE: 210 mg/dL — AB (ref 65–99)
POTASSIUM: 3.9 mmol/L (ref 3.5–5.2)
Sodium: 138 mmol/L (ref 134–144)

## 2016-03-30 ENCOUNTER — Encounter: Payer: Self-pay | Admitting: Family Medicine

## 2016-05-01 ENCOUNTER — Ambulatory Visit: Payer: BLUE CROSS/BLUE SHIELD | Admitting: Family Medicine

## 2016-05-22 ENCOUNTER — Other Ambulatory Visit: Payer: Self-pay | Admitting: Family Medicine

## 2016-07-16 ENCOUNTER — Other Ambulatory Visit: Payer: Self-pay | Admitting: Family Medicine

## 2016-07-16 NOTE — Telephone Encounter (Signed)
Last OV: 03/27/16 Next OV: None on file  BMP Latest Ref Rng & Units 03/27/2016 01/24/2016 01/01/2014  Glucose 65 - 99 mg/dL 696(E210(H) 952(W137(H) 413(K105(H)  BUN 6 - 24 mg/dL 13 15 12   Creatinine 0.76 - 1.27 mg/dL 4.401.03 1.021.04 7.250.81  BUN/Creat Ratio 9 - 20 13 14  -  Sodium 134 - 144 mmol/L 138 139 140  Potassium 3.5 - 5.2 mmol/L 3.9 4.1 4.8  Chloride 96 - 106 mmol/L 99 100 105  CO2 18 - 29 mmol/L 22 22 23   Calcium 8.7 - 10.2 mg/dL 9.4 8.9 8.8

## 2016-10-11 ENCOUNTER — Other Ambulatory Visit: Payer: Self-pay | Admitting: Family Medicine

## 2017-01-09 ENCOUNTER — Other Ambulatory Visit: Payer: Self-pay | Admitting: Family Medicine

## 2017-02-10 ENCOUNTER — Other Ambulatory Visit: Payer: Self-pay | Admitting: Family Medicine

## 2017-04-15 ENCOUNTER — Other Ambulatory Visit: Payer: Self-pay | Admitting: Family Medicine

## 2018-01-11 ENCOUNTER — Telehealth (INDEPENDENT_AMBULATORY_CARE_PROVIDER_SITE_OTHER): Payer: Self-pay | Admitting: Specialist

## 2018-01-11 NOTE — Telephone Encounter (Signed)
Patient called to double check we had received his medical records from [facility]. I told him once Dr.Nitka reviewed them he would get a call to set up appt. # [phone]

## 2018-01-13 NOTE — Telephone Encounter (Signed)
Records are here, still pending Dr. Barbaraann FasterNitka's review

## 2018-02-01 ENCOUNTER — Encounter (INDEPENDENT_AMBULATORY_CARE_PROVIDER_SITE_OTHER): Payer: Self-pay | Admitting: Specialist

## 2018-02-01 ENCOUNTER — Ambulatory Visit (INDEPENDENT_AMBULATORY_CARE_PROVIDER_SITE_OTHER): Payer: BLUE CROSS/BLUE SHIELD | Admitting: Specialist

## 2018-02-01 ENCOUNTER — Ambulatory Visit (INDEPENDENT_AMBULATORY_CARE_PROVIDER_SITE_OTHER): Payer: Self-pay

## 2018-02-01 VITALS — BP 174/95 | HR 71 | Ht 72.0 in | Wt 275.0 lb

## 2018-02-01 DIAGNOSIS — M1611 Unilateral primary osteoarthritis, right hip: Secondary | ICD-10-CM | POA: Diagnosis not present

## 2018-02-01 DIAGNOSIS — M542 Cervicalgia: Secondary | ICD-10-CM

## 2018-02-01 DIAGNOSIS — M4726 Other spondylosis with radiculopathy, lumbar region: Secondary | ICD-10-CM

## 2018-02-01 DIAGNOSIS — M4804 Spinal stenosis, thoracic region: Secondary | ICD-10-CM

## 2018-02-01 DIAGNOSIS — M4712 Other spondylosis with myelopathy, cervical region: Secondary | ICD-10-CM | POA: Diagnosis not present

## 2018-02-01 DIAGNOSIS — M545 Low back pain: Secondary | ICD-10-CM

## 2018-02-01 DIAGNOSIS — M4722 Other spondylosis with radiculopathy, cervical region: Secondary | ICD-10-CM

## 2018-02-01 DIAGNOSIS — Z981 Arthrodesis status: Secondary | ICD-10-CM

## 2018-02-01 NOTE — Progress Notes (Signed)
Office Visit Note   Patient: Eddie Alexander           Date of Birth: December 15, 1960           MRN: [id number] Visit Date: 02/01/2018              Requested by: Dorcas Carrow, DO [address] [place], [place] [address] PCP: Dorcas Carrow, DO   Assessment & Plan: Visit Diagnoses:  1. Low back pain, unspecified back pain laterality, unspecified chronicity, with sciatica presence unspecified   2. Other spondylosis with radiculopathy, lumbar region   3. Arthritis of right hip   4. Cervical spondylosis with myelopathy and radiculopathy   5. S/P cervical spinal fusion     Plan: Patient's current complaints Dr. Otelia Sergeant is recommending complete CT myelo of the cervical, thoracic and lumbar spine.  Cervical MRI was reviewed from 2015 and patient did have cord signal changes.  Dr. 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  Question whether or not this is causing the hyperreflexia of the left lower extremity.  For his right groin pain we will schedule diagnostic/therapeutic intra-articular Marcaine/Depo-Medrol injection with Dr. Alvester Morin.  Patient to pay close attention I feels after the injection.  With the significant degenerative changes of the right hip ultimately it will likely come down to him needing right total hip replacement.  We will refer him to Dr. Doneen Poisson here in our office for that.  Also recommended that patient be in contact with his cardiologist to see if a sooner appointment needs to be scheduled than the one in October.  He is having issues with exertional dyspnea, orthopnea and bilateral lower leg swelling.  Follow-Up Instructions: Return in about 3 weeks (around 02/22/2018) for With Dr. Otelia Sergeant to review CT scans and recheck right hip after injection.   Orders:  Orders Placed This Encounter  Procedures  . XR Lumbar Spine 2-3 Views   No orders of the defined types were placed in this encounter.     Procedures: No procedures performed   Clinical Data: No  additional findings.   Subjective: Chief Complaint  Patient presents with  . Lower Back - Pain    HPI 57 year old white male who is new patient to clinic comes in with complaints of back pain, right hip pain, right leg pain and left leg twitching.  Patient is status post C5-6 and C6-7 ACDF for myelopathy by Dr. Tressie Stalker January 08, 2014.  Before his surgery cervical spine MRI showed:  CLINICAL DATA:  Bilateral lower extremity tingling. The patient is  dragging his legs.   EXAM:  MRI CERVICAL SPINE WITHOUT CONTRAST   TECHNIQUE:  Multiplanar, multisequence MR imaging of the cervical spine was  performed. No intravenous contrast was administered.   COMPARISON:  None.   FINDINGS:  Vertebral body height is maintained. Degenerative endplate signal  change is identified at C5-6 and C6-7. No worrisome marrow lesion is  seen. The craniocervical junction is normal. There is edema within  the left hemi cord at the C5-6 level. There is mild prominence of  the central medullary canal distal to the C5-6 level. The patient  has a congenitally narrow central spinal canal. Paravertebral soft  tissue structures are unremarkable.   C2-3: Mild facet arthropathy and minimal disc bulging without  central canal or foraminal stenosis.   C3-4: Minimal posterior bony ridging without central canal or  foraminal stenosis.   C4-5: The patient has a shallow disc osteophyte  complex and very  small central protrusion. The central canal and foramina are mildly  narrowed.   C5-6: There is a disc osteophyte complex and superimposed central  protrusion flattening the cord. Cord deformity is worse on the left.  Uncovertebral disease causes moderately severe bilateral foraminal  narrowing.   C6-7: There is a broad-based disc bulge deforming the ventral cord.  Uncovertebral disease causes moderately severe bilateral foraminal  narrowing.   C7-T1:  Negative.   IMPRESSION:  Disc bulge  with a superimposed central protrusion and congenitally  narrow central canal at C5-6 result in marked flattening of the  cord, worse on the left. There is edema within the left hemi cord.  Moderately severe bilateral foraminal narrowing is present C5-6.   Flattening of the ventral cord and moderately severe bilateral  foraminal narrowing C6-7.   Mild prominence of the central medullary canal versus small syrinx  is likely secondary to stenosis at C5-6.   These results will be called to the ordering clinician or  representative by the Radiologist Assistant, and communication  documented in the PACS or zVision Dashboard.   Patient states that he had left leg twitching before the surgery and this may have somewhat improved immediately after the surgery this is becoming more bothersome.  Overall he is pleased with his cervical fusion outcome.  I have reviewed notes from Dr. Lovell Sheehan office and he had recommended at one point doing a lumbar decompression for stenosis at L3-4 and L4-5.  Patient elected to not have surgery.  He did go for another opinion at [facility] orth[facility] and was seen by physiatrist Dr. Sheran Luz and patient states that he was told that he would need a lumbar fusion.  Regards to his back he states that he gets pain that radiates down to his right foot.  He also gets bilateral leg cramping at night.  Back pain itself is progressively getting worse.  In the grocery store he does have to lean forward over a cart in hopes of relieving some of his back pain.  For right hip he localizes most of his pain to the groin.  States that he has been unable to flex his right hip for a while.  Finds it very difficult to put his right shoe on.  Groin pain with ambulation and hip rotation.  States that his hip is never been addressed.  Patient states that he is also been having more swelling in both of his lower legs.  He has a history of coronary artery disease and has stents.  Scheduled to  see his cardiologist next month.  Admits exertional dyspnea, orthopnea and some increased weight gain.   Review of Systems Patient admits exertional dyspnea, orthopnea, bilateral leg swelling and recent weight gain.  Upcoming appointment with cardiologist next month  Objective: Vital Signs: BP (!) 174/95 (BP Location: Left Arm, Patient Position: Sitting)   Pulse 71   Ht 6' (1.829 m)   Wt 275 lb (124.7 kg)   BMI 37.30 kg/m   Physical Exam  Constitutional: He is oriented to person, place, and time. No distress.  HENT:  Head: Normocephalic.  Eyes: Pupils are equal, round, and reactive to light. EOM are normal.  Pulmonary/Chest: No respiratory distress.  Musculoskeletal:  Gait is antalgic.  No lumbar paraspinal tenderness.  Central low back pain with lumbar extension.  Lumbar flexion with hands to knees.  Left hip good internal/external rotation without pain.  Right hip he has about 5 to 10 degrees internal/external rotation  with groin pain.  Pain and weakness with right hip flexion.  Negative straight leg raise.  Bilateral calves nontender.  Left knee jerk hyperreflexia.  Does have trace left anterior tib weakness.  Neurological: He is alert and oriented to person, place, and time.  Skin: Skin is warm and dry.  Psychiatric: He has a normal mood and affect.    Ortho Exam  Specialty Comments:  No specialty comments available.  Imaging: No results found.   PMFS History: Patient Active Problem List   Diagnosis Date Noted  . Class 2 obesity due to excess calories with serious comorbidity and body mass index (BMI) of 37.0 to 37.9 in adult 03/27/2016  . IFG (impaired fasting glucose) 01/24/2016  . Hypertension   . Hyperlipidemia   . CAD (coronary artery disease)   . Cervical spondylosis with myelopathy and radiculopathy 01/08/2014   Past Medical History:  Diagnosis Date  . Back pain   . CAD (coronary artery disease)   . Cervical spondylosis with myelopathy   . GERD  (gastroesophageal reflux disease)   . H/O urinary frequency   . Hyperlipidemia   . Hypertension   . Myocardial infarction [facility]) 2011   caused VF arresr; s/p LAD bare metal stent; Sees Dr Romeo Apple @ [facility] s/p stent    Family History  Problem Relation Age of Onset  . Heart disease Mother   . Heart disease Maternal Grandfather   . Heart disease Paternal Grandmother   . Heart disease Paternal Grandfather     Past Surgical History:  Procedure Laterality Date  . ANTERIOR CERVICAL DECOMP/DISCECTOMY FUSION N/A 01/08/2014   Procedure: ANTERIOR CERVICAL DECOMPRESSION/DISCECTOMY FUSION 2 LEVELS;  Surgeon: Tressie Stalker, MD;  Location: [facility];  Service: Neurosurgery;  Laterality: N/A;  Cervical Five-Six/Six-Seven Anterior Cervical Decompression with Fusoin interbody prosthesis plating and bonegraft  . CORONARY ANGIOPLASTY  [id number]   Dr Bernette Redbird at [facility]  . TONSILLECTOMY     Social History   Occupational History  . Not on file  Tobacco Use  . Smoking status: Former Smoker    Types: Cigars    Last attempt to quit: 01/01/2013    Years since quitting: 5.0  . Smokeless tobacco: Never Used  Substance and Sexual Activity  . Alcohol use: Yes    Alcohol/week: 3.0 standard drinks    Types: 3 Shots of liquor per week  . Drug use: No  . Sexual activity: Yes

## 2018-02-14 ENCOUNTER — Ambulatory Visit (INDEPENDENT_AMBULATORY_CARE_PROVIDER_SITE_OTHER): Payer: Self-pay

## 2018-02-14 ENCOUNTER — Encounter (INDEPENDENT_AMBULATORY_CARE_PROVIDER_SITE_OTHER): Payer: Self-pay | Admitting: Physical Medicine and Rehabilitation

## 2018-02-14 ENCOUNTER — Ambulatory Visit (INDEPENDENT_AMBULATORY_CARE_PROVIDER_SITE_OTHER): Payer: BLUE CROSS/BLUE SHIELD | Admitting: Physical Medicine and Rehabilitation

## 2018-02-14 DIAGNOSIS — M25551 Pain in right hip: Secondary | ICD-10-CM

## 2018-02-14 NOTE — Patient Instructions (Signed)

## 2018-02-14 NOTE — Progress Notes (Signed)
Eddie Alexander - 57 y.o. male MRN [id number]  Date of birth: [birth year]  Office Visit Note: Visit Date: 02/14/2018 PCP: Dorcas CarrowJohnson, Megan P, DO Referred by: Dorcas CarrowJohnson, Megan P, DO  Subjective: Chief Complaint  Patient presents with  . Right Hip - Pain   HPI: [name] is a 57 year old gentleman with history of back and hip and leg pain that has been chronic and recalcitrant.  He has had prior ACDF by Dr. Lovell SheehanJenkins for what was pretty significant C5-6 narrowing with cord edema on the left.  He reports that was done to try to alleviate his symptoms he was having really in the left leg and those are really not been relieved.  He does get cramping etc.  He [place]itka [place] though it was having a lot of consistent pain with the right hip and groin area anteriorly worse with getting in and out of the car.  Having trouble bending over.  Images of the hip do show consistent pretty significant arthritis.  We are going to complete a diagnostic and hopefully therapeutic anesthetic hip arthrogram on the right.   ROS Otherwise per HPI.  Assessment & Plan: Visit Diagnoses:  1. Pain in right hip     Plan: Findings:  Diagnostic and therapeutic anesthetic hip arthrogram on the right.  Patient did have relief of right sided symptoms during the anesthetic phase.    Meds & Orders: No orders of the defined types were placed in this encounter.   Orders Placed This Encounter  Procedures  . Large Joint Inj: R hip joint  . XR C-ARM NO REPORT    Follow-up: Return if symptoms worsen or fail to improve.   Procedures: Large Joint Inj: R hip joint on 02/14/2018 3:42 PM Indications: pain and diagnostic evaluation Details: 22 G needle, anterior approach  Arthrogram: Yes  Medications: 80 mg triamcinolone acetonide 40 MG/ML; 3 mL bupivacaine 0.5 % Outcome: tolerated well, no immediate complications  Arthrogram demonstrated excellent flow of contrast throughout the joint surface without  extravasation or obvious defect.  The patient had relief of symptoms during the anesthetic phase of the injection.  Procedure, treatment alternatives, risks and benefits explained, specific risks discussed. Consent was given by the patient. Immediately prior to procedure a time out was called to verify the correct patient, procedure, equipment, support staff and site/side marked as required. Patient was prepped and draped in the usual sterile fashion.      No notes on file   Clinical History: No specialty comments available.   He reports that he quit smoking about 5 years ago. His smoking use included cigars. He has never used smokeless tobacco. No results for input(s): HGBA1C, LABURIC in the last 8760 hours.  Objective:  VS:  HT:    WT:   BMI:     BP:   HR: bpm  TEMP: ( )  RESP:  Physical Exam  Ortho Exam Imaging: Xr C-arm No Report  Result Date: 02/14/2018 Please see Notes tab for imaging impression.   Past Medical/Family/Surgical/Social History: Medications & Allergies reviewed per EMR, new medications updated. Patient Active Problem List   Diagnosis Date Noted  . Class 2 obesity due to excess calories with serious comorbidity and body mass index (BMI) of 37.0 to 37.9 in adult 03/27/2016  . IFG (impaired fasting glucose) 01/24/2016  . Hypertension   . Hyperlipidemia   . CAD (coronary artery disease)   . Cervical spondylosis with myelopathy and radiculopathy 01/08/2014  Past Medical History:  Diagnosis Date  . Back pain   . CAD (coronary artery disease)   . Cervical spondylosis with myelopathy   . GERD (gastroesophageal reflux disease)   . H/O urinary frequency   . Hyperlipidemia   . Hypertension   . Myocardial infarction [facility]) 2011   caused VF arresr; s/p LAD bare metal stent; Sees Dr Romeo Apple @ [facility] s/p stent   Family History  Problem Relation Age of Onset  . Heart disease Mother   . Heart disease Maternal Grandfather   . Heart disease Paternal  Grandmother   . Heart disease Paternal Grandfather    Past Surgical History:  Procedure Laterality Date  . ANTERIOR CERVICAL DECOMP/DISCECTOMY FUSION N/A 01/08/2014   Procedure: ANTERIOR CERVICAL DECOMPRESSION/DISCECTOMY FUSION 2 LEVELS;  Surgeon: Tressie Stalker, MD;  Location: [facility];  Service: Neurosurgery;  Laterality: N/A;  Cervical Five-Six/Six-Seven Anterior Cervical Decompression with Fusoin interbody prosthesis plating and bonegraft  . CORONARY ANGIOPLASTY  [id number]   Dr Bernette Redbird at [facility]  . TONSILLECTOMY     Social History   Occupational History  . Not on file  Tobacco Use  . Smoking status: Former Smoker    Types: Cigars    Last attempt to quit: 01/01/2013    Years since quitting: 5.1  . Smokeless tobacco: Never Used  Substance and Sexual Activity  . Alcohol use: Yes    Alcohol/week: 3.0 standard drinks    Types: 3 Shots of liquor per week  . Drug use: No  . Sexual activity: Yes

## 2018-02-14 NOTE — Progress Notes (Signed)
 .  Numeric Pain Rating Scale and Functional Assessment Average Pain 7   In the last MONTH (on 0-10 scale) has pain interfered with the following?  1. General activity like being  able to carry out your everyday physical activities such as walking, climbing stairs, carrying groceries, or moving a chair?  Rating(5)  -Dye Allergies.  

## 2018-02-15 MED ORDER — BUPIVACAINE HCL 0.5 % IJ SOLN
3.0000 mL | INTRAMUSCULAR | Status: AC | PRN
  Administered 2018-02-14: 3 mL via INTRA_ARTICULAR

## 2018-02-15 MED ORDER — TRIAMCINOLONE ACETONIDE 40 MG/ML IJ SUSP
80.0000 mg | INTRAMUSCULAR | Status: AC | PRN
  Administered 2018-02-14: 80 mg via INTRA_ARTICULAR

## 2018-03-03 ENCOUNTER — Ambulatory Visit (INDEPENDENT_AMBULATORY_CARE_PROVIDER_SITE_OTHER): Payer: BLUE CROSS/BLUE SHIELD | Admitting: Specialist

## 2018-03-10 ENCOUNTER — Other Ambulatory Visit (INDEPENDENT_AMBULATORY_CARE_PROVIDER_SITE_OTHER): Payer: Self-pay | Admitting: Specialist

## 2018-03-10 DIAGNOSIS — M545 Low back pain: Principal | ICD-10-CM

## 2018-03-10 DIAGNOSIS — G8929 Other chronic pain: Secondary | ICD-10-CM

## 2018-03-14 ENCOUNTER — Other Ambulatory Visit (INDEPENDENT_AMBULATORY_CARE_PROVIDER_SITE_OTHER): Payer: Self-pay | Admitting: Specialist

## 2018-03-14 DIAGNOSIS — M545 Low back pain, unspecified: Secondary | ICD-10-CM

## 2018-03-14 DIAGNOSIS — G8929 Other chronic pain: Secondary | ICD-10-CM

## 2018-03-25 ENCOUNTER — Telehealth (INDEPENDENT_AMBULATORY_CARE_PROVIDER_SITE_OTHER): Payer: Self-pay | Admitting: Specialist

## 2018-03-25 NOTE — Telephone Encounter (Signed)
Patient has MRI scheduled on 03/28/18. Patient needs a RV for MRI REVIEW the first available is 05/05/18-plese add to cancellation list.

## 2018-03-25 NOTE — Telephone Encounter (Signed)
I have put him on the cancellation list and will call if something opens up

## 2018-03-28 ENCOUNTER — Ambulatory Visit
Admission: RE | Admit: 2018-03-28 | Discharge: 2018-03-28 | Disposition: A | Discharge status: Home / Self Care | Payer: BC Managed Care – PPO | Source: Ambulatory Visit | Attending: Specialist | Admitting: Specialist

## 2018-03-28 ENCOUNTER — Ambulatory Visit
Admission: RE | Admit: 2018-03-28 | Discharge: 2018-03-28 | Disposition: A | Discharge status: Home / Self Care | Payer: BLUE CROSS/BLUE SHIELD | Source: Ambulatory Visit | Attending: Specialist | Admitting: Specialist

## 2018-03-28 VITALS — BP 169/99 | HR 67

## 2018-03-28 DIAGNOSIS — M4804 Spinal stenosis, thoracic region: Secondary | ICD-10-CM

## 2018-03-28 DIAGNOSIS — M542 Cervicalgia: Secondary | ICD-10-CM

## 2018-03-28 DIAGNOSIS — G8929 Other chronic pain: Secondary | ICD-10-CM

## 2018-03-28 DIAGNOSIS — M545 Low back pain, unspecified: Secondary | ICD-10-CM

## 2018-03-28 DIAGNOSIS — M4722 Other spondylosis with radiculopathy, cervical region: Principal | ICD-10-CM

## 2018-03-28 DIAGNOSIS — M4712 Other spondylosis with myelopathy, cervical region: Secondary | ICD-10-CM

## 2018-03-28 MED ORDER — DIAZEPAM 5 MG PO TABS
10.0000 mg | ORAL_TABLET | Freq: Once | ORAL | Status: AC
  Administered 2018-03-28: 10 mg via ORAL

## 2018-03-28 MED ORDER — ONDANSETRON HCL 4 MG/2ML IJ SOLN
4.0000 mg | Freq: Once | INTRAMUSCULAR | Status: AC
  Administered 2018-03-28: 4 mg via INTRAMUSCULAR

## 2018-03-28 MED ORDER — IOPAMIDOL (ISOVUE-M 300) INJECTION 61%
10.0000 mL | Freq: Once | INTRAMUSCULAR | Status: AC | PRN
  Administered 2018-03-28: 10 mL via INTRATHECAL

## 2018-03-28 MED ORDER — MEPERIDINE HCL 50 MG/ML IJ SOLN
50.0000 mg | Freq: Once | INTRAMUSCULAR | Status: AC
  Administered 2018-03-28: 50 mg via INTRAMUSCULAR

## 2018-03-28 NOTE — Discharge Instructions (Signed)

## 2018-04-05 ENCOUNTER — Telehealth (INDEPENDENT_AMBULATORY_CARE_PROVIDER_SITE_OTHER): Payer: Self-pay | Admitting: Specialist

## 2018-04-05 NOTE — Telephone Encounter (Signed)
Patient called asked if he can get a Rx for Hydrocodone until he come in for his appointment on 05/05/18 at 3:45pm. The  Number to contact patient is [phone]

## 2018-04-07 NOTE — Telephone Encounter (Signed)
Moved appt to 04/08/18

## 2018-04-08 ENCOUNTER — Ambulatory Visit (INDEPENDENT_AMBULATORY_CARE_PROVIDER_SITE_OTHER): Payer: BLUE CROSS/BLUE SHIELD | Admitting: Specialist

## 2018-04-08 ENCOUNTER — Encounter (INDEPENDENT_AMBULATORY_CARE_PROVIDER_SITE_OTHER): Payer: Self-pay | Admitting: Specialist

## 2018-04-08 DIAGNOSIS — M1611 Unilateral primary osteoarthritis, right hip: Secondary | ICD-10-CM | POA: Diagnosis not present

## 2018-04-08 DIAGNOSIS — M48062 Spinal stenosis, lumbar region with neurogenic claudication: Secondary | ICD-10-CM

## 2018-04-08 MED ORDER — HYDROCODONE-ACETAMINOPHEN 5-325 MG PO TABS: 1.0000 | ORAL_TABLET | Freq: Four times a day (QID) | ORAL | 0 refills | Status: DC | PRN

## 2018-04-08 NOTE — Patient Instructions (Addendum)
Avoid bending, stooping and avoid lifting weights greater than 10 lbs. Avoid prolong standing and walking. Avoid frequent bending and stooping  No lifting greater than 10 lbs. May use ice or moist heat for pain. Weight loss is of benefit. Handicap license is approved. You had severe right hip osteoarthritis with excellent response to intrarticular injection so I would recommend right total hip replacement and eventually lumbar decompressive lumbar laminectomy. Hydrocodone for dicomfort .  Due to history of AMI and need for aspirin and plavix I do not recommend the use of NSAIDs  Keep your appointment with Dr. Magnus Ivan for right [facility] evaluation.

## 2018-04-08 NOTE — Progress Notes (Signed)
Office Visit Note   Patient: Eddie Alexander           Date of Birth: 08-13-1960           MRN: [id number] Visit Date: 04/08/2018              Requested by: Dorcas Carrow, DO [address] [place], [place] [address] PCP: Dorcas Carrow, DO   Assessment & Plan: Visit Diagnoses:  1. Spinal stenosis, lumbar region with neurogenic claudication   2. Unilateral primary osteoarthritis, right hip     Plan: Avoid bending, stooping and avoid lifting weights greater than 10 lbs. Avoid prolong standing and walking. Avoid frequent bending and stooping  No lifting greater than 10 lbs. May use ice or moist heat for pain. Weight loss is of benefit. Handicap license is approved. You had severe right hip osteoarthritis with excellent response to intrarticular injection so I would recommend right total hip replacement and eventually lumbar decompressive lumbar laminectomy. Hydrocodone for dicomfort.  Due to history of AMI and need for aspirin and plavix I do not recommend the use of NSAIDs Keep your appointment with Dr. Magnus Ivan for right [facility] evaluation.   Follow-Up Instructions: Return in about 3 months (around 07/09/2018).   Orders:  No orders of the defined types were placed in this encounter.  No orders of the defined types were placed in this encounter.     Procedures: No procedures performed   Clinical Data: No additional findings.   Subjective: Chief Complaint  Patient presents with  . Spine - Follow-up    Myelogram Review    57 year old male with history of cervical ACDF C5-6 for spinal stenosis and myelopathy with Dr. Lovell Sheehan 4 years. He had grip weakness and clumbsiness with leg weakness on the left side more than right. Sneezing with pain. Radiographs with hip OA, had injection right hip 01/2018 with relief of hip pain for several weeks then the pain recurred. Underwent myelogram and post myelogram   Review of Systems  Constitutional: Positive for activity change. Negative  for appetite change, chills, diaphoresis, fatigue, fever and unexpected weight change.  HENT: Negative.  Negative for congestion, dental problem, drooling, ear discharge, ear pain, facial swelling, hearing loss, mouth sores, nosebleeds, postnasal drip, rhinorrhea, sinus pressure, sinus pain, sneezing, sore throat, tinnitus, trouble swallowing and voice change.   Eyes: Negative.  Negative for photophobia, pain, discharge, redness, itching and visual disturbance.  Respiratory: Negative.  Negative for apnea, cough, choking, chest tightness, shortness of breath, wheezing and stridor.   Cardiovascular: Negative.  Negative for chest pain, palpitations and leg swelling.  Gastrointestinal: Negative.  Negative for abdominal distention, abdominal pain, anal bleeding, blood in stool, constipation, diarrhea and nausea.  Endocrine: Negative.  Negative for cold intolerance, heat intolerance, polydipsia, polyphagia and polyuria.  Genitourinary: Negative.  Negative for difficulty urinating, dysuria, enuresis, flank pain and frequency.  Musculoskeletal: Negative.  Negative for arthralgias, back pain, gait problem, joint swelling, myalgias, neck pain and neck stiffness.  Skin: Negative.   Allergic/Immunologic: Negative.  Negative for environmental allergies, food allergies and immunocompromised state.  Neurological: Negative.  Negative for dizziness, tremors, seizures, syncope, facial asymmetry, speech difficulty, weakness, light-headedness, numbness and headaches.  Hematological: Negative.   Psychiatric/Behavioral: Negative.  Negative for agitation, behavioral problems, confusion, decreased concentration, dysphoric mood, hallucinations, self-injury, sleep disturbance and suicidal ideas. The patient is not nervous/anxious and is not hyperactive.      Objective: Vital Signs: There were no vitals taken for this  visit.  Physical Exam  Constitutional: He is oriented to person, place, and time. He appears  well-developed and well-nourished.  HENT:  Head: Normocephalic and atraumatic.  Eyes: Pupils are equal, round, and reactive to light. EOM are normal.  Neck: Normal range of motion. Neck supple.  Pulmonary/Chest: Effort normal and breath sounds normal.  Abdominal: Soft. Bowel sounds are normal.  Neurological: He is alert and oriented to person, place, and time.  Skin: Skin is warm and dry.  Psychiatric: He has a normal mood and affect. His behavior is normal. Judgment and thought content normal.    Right Hip Exam   Tenderness  The patient is experiencing tenderness in the anterior.  Range of Motion  Abduction:  20 abnormal  Adduction: 20  Extension: 20  Flexion: 120  External rotation: 20  Internal rotation: 20   Muscle Strength  Abduction: 5/5  Adduction: 5/5  Flexion: 5/5   Tests  Ober: negative  Other  Erythema: absent Scars: absent Sensation: normal Pulse: present   Back Exam   Tenderness  The patient is experiencing tenderness in the lumbar.  Range of Motion  Extension: abnormal  Flexion: normal  Lateral bend right: normal  Rotation right: normal  Rotation left: normal   Muscle Strength  Right Quadriceps:  5/5  Left Quadriceps:  5/5  Right Hamstrings:  5/5  Left Hamstrings:  5/5   Tests  Straight leg raise right: negative Straight leg raise left: negative  Reflexes  Patellar: normal Achilles: normal Biceps: normal Babinski's sign: normal   Other  Toe walk: normal Heel walk: normal Sensation: normal Gait: normal  Erythema: no back redness Scars: absent      Specialty Comments:  No specialty comments available.  Imaging: No results found.   PMFS History: Patient Active Problem List   Diagnosis Date Noted  . Class 2 obesity due to excess calories with serious comorbidity and body mass index (BMI) of 37.0 to 37.9 in adult 03/27/2016  . IFG (impaired fasting glucose) 01/24/2016  . Hypertension   . Hyperlipidemia   . CAD  (coronary artery disease)   . Cervical spondylosis with myelopathy and radiculopathy 01/08/2014   Past Medical History:  Diagnosis Date  . Back pain   . CAD (coronary artery disease)   . Cervical spondylosis with myelopathy   . GERD (gastroesophageal reflux disease)   . H/O urinary frequency   . Hyperlipidemia   . Hypertension   . Myocardial infarction [facility]) 2011   caused VF arresr; s/p LAD bare metal stent; Sees Dr Romeo Apple @ [facility] s/p stent    Family History  Problem Relation Age of Onset  . Heart disease Mother   . Heart disease Maternal Grandfather   . Heart disease Paternal Grandmother   . Heart disease Paternal Grandfather     Past Surgical History:  Procedure Laterality Date  . ANTERIOR CERVICAL DECOMP/DISCECTOMY FUSION N/A 01/08/2014   Procedure: ANTERIOR CERVICAL DECOMPRESSION/DISCECTOMY FUSION 2 LEVELS;  Surgeon: Tressie Stalker, MD;  Location: [facility];  Service: Neurosurgery;  Laterality: N/A;  Cervical Five-Six/Six-Seven Anterior Cervical Decompression with Fusoin interbody prosthesis plating and bonegraft  . CORONARY ANGIOPLASTY  [id number]   Dr Bernette Redbird at [facility]  . TONSILLECTOMY     Social History   Occupational History  . Not on file  Tobacco Use  . Smoking status: Former Smoker    Types: Cigars    Last attempt to quit: 01/01/2013    Years since quitting: 5.2  . Smokeless tobacco:  Never Used  Substance and Sexual Activity  . Alcohol use: Yes    Alcohol/week: 3.0 standard drinks    Types: 3 Shots of liquor per week  . Drug use: No  . Sexual activity: Yes

## 2018-04-20 ENCOUNTER — Encounter (INDEPENDENT_AMBULATORY_CARE_PROVIDER_SITE_OTHER): Payer: Self-pay | Admitting: Orthopaedic Surgery

## 2018-04-20 ENCOUNTER — Ambulatory Visit (INDEPENDENT_AMBULATORY_CARE_PROVIDER_SITE_OTHER): Payer: Self-pay

## 2018-04-20 ENCOUNTER — Ambulatory Visit (INDEPENDENT_AMBULATORY_CARE_PROVIDER_SITE_OTHER): Payer: BLUE CROSS/BLUE SHIELD | Admitting: Orthopaedic Surgery

## 2018-04-20 DIAGNOSIS — M1611 Unilateral primary osteoarthritis, right hip: Secondary | ICD-10-CM | POA: Diagnosis not present

## 2018-04-20 DIAGNOSIS — M25551 Pain in right hip: Secondary | ICD-10-CM | POA: Diagnosis not present

## 2018-04-20 NOTE — Progress Notes (Signed)
Office Visit Note   Patient: Eddie Alexander           Date of Birth: July 28, 1960           MRN: [id number] Visit Date: 04/20/2018              Requested by: Dorcas CarrowJohnson, Megan P, DO [address] [place], [place] [address] PCP: Dorcas CarrowJohnson, Megan P, DO   Assessment & Plan: Visit Diagnoses:  1. Pain in right hip   2. Unilateral primary osteoarthritis, right hip     Plan: At this point I agree his arthritis is severe and he is at the point where we are recommending hip replacement surgery.  I showed him a hip model and we went over x-rays in detail.  I gave him a handout about hip replacement surgery.  We had a long thorough discussion about his intraoperative and postoperative course as well as detailed discussion of the risk and benefits of surgery.  We will have him stop his Plavix at least 6 to 7 days prior to surgery.  All question concerns were answered and addressed.  He would like to have this scheduled soon and I agree.  Follow-Up Instructions: Return for 2 weeks post-op.   Orders:  Orders Placed This Encounter  Procedures  . XR HIP UNILAT W OR W/O PELVIS 1V RIGHT   No orders of the defined types were placed in this encounter.     Procedures: No procedures performed   Clinical Data: No additional findings.   Subjective: Chief Complaint  Patient presents with  . Right Hip - Pain  The patient is sent from Dr. Otelia SergeantNitka to evaluate him for the possibility of right hip replacement.  He has x-rays that show severe end-stage arthritis of his right hip.  His pain is daily and is 10 out of 10.  This is on his right hip and groin area.  He has trouble getting his shoes and socks on and off.  He walks with significant limp as well.  At this point the arthritis in his right hip is detrimentally affecting his activity living, his quality of life and his mobility.  He has a BMI of 37.3 and is work on activity modification and weight loss.  He said conservative treatment for now over a year.  He has  had a intra-articular injection which did help for about a week in the right hip.  He has dealt with back issues in the past.  He is on Plavix due to previous heart attack.  HPI  Review of Systems He currently denies any headache, chest pain, shortness of breath, fever, chills, nausea, vomiting  Objective: Vital Signs: There were no vitals taken for this visit.  Physical Exam He is alert and oriented x3 and in no acute distress Ortho Exam Examination of his left hip is normal examination of his right hip essentially shows no internal and external rotation and significant stiffness with attempts at rotation.  He also has severe pain with attempts of rotation of his right hip. Specialty Comments:  No specialty comments available.  Imaging: Xr Hip Unilat W Or W/o Pelvis 1v Right  Result Date: 04/20/2018 An AP pelvis and lateral of the right hip show severe end-stage arthritis of the right hip.  There is complete loss of joint space.  There is flattening of the femoral head.  There are sclerotic changes in the femoral head and the acetabulum.  There are large para-articular osteophytes.  PMFS History: Patient Active Problem List   Diagnosis Date Noted  . Unilateral primary osteoarthritis, right hip 04/20/2018  . Class 2 obesity due to excess calories with serious comorbidity and body mass index (BMI) of 37.0 to 37.9 in adult 03/27/2016  . IFG (impaired fasting glucose) 01/24/2016  . Hypertension   . Hyperlipidemia   . CAD (coronary artery disease)   . Cervical spondylosis with myelopathy and radiculopathy 01/08/2014   Past Medical History:  Diagnosis Date  . Back pain   . CAD (coronary artery disease)   . Cervical spondylosis with myelopathy   . GERD (gastroesophageal reflux disease)   . H/O urinary frequency   . Hyperlipidemia   . Hypertension   . Myocardial infarction [facility]) 2011   caused VF arresr; s/p LAD bare metal stent; Sees Dr Romeo Apple @ [facility] s/p stent      Family History  Problem Relation Age of Onset  . Heart disease Mother   . Heart disease Maternal Grandfather   . Heart disease Paternal Grandmother   . Heart disease Paternal Grandfather     Past Surgical History:  Procedure Laterality Date  . ANTERIOR CERVICAL DECOMP/DISCECTOMY FUSION N/A 01/08/2014   Procedure: ANTERIOR CERVICAL DECOMPRESSION/DISCECTOMY FUSION 2 LEVELS;  Surgeon: Tressie Stalker, MD;  Location: [facility];  Service: Neurosurgery;  Laterality: N/A;  Cervical Five-Six/Six-Seven Anterior Cervical Decompression with Fusoin interbody prosthesis plating and bonegraft  . CORONARY ANGIOPLASTY  [id number]   Dr Bernette Redbird at [facility]  . TONSILLECTOMY     Social History   Occupational History  . Not on file  Tobacco Use  . Smoking status: Former Smoker    Types: Cigars    Last attempt to quit: 01/01/2013    Years since quitting: 5.3  . Smokeless tobacco: Never Used  Substance and Sexual Activity  . Alcohol use: Yes    Alcohol/week: 3.0 standard drinks    Types: 3 Shots of liquor per week  . Drug use: No  . Sexual activity: Yes

## 2018-04-25 NOTE — Pre-Procedure Instructions (Signed)
Eddie CirriWalter B Alexander  04/25/2018      [id number] [place]- , [place] - [address] [place] [address] Phone: [phone] Fax: [phone]    Your procedure is scheduled on Tuesday December 10th.  [facility] at 0730 A.M.  Call this number if you have problems the morning of surgery:  [phone]   Remember:  Do not eat or drink after midnight.    Take these medicines the morning of surgery with A SIP OF WATER   amLODipine (NORVASC)  atorvastatin (LIPITOR)  carvedilol (COREG  Follow your surgeon's instructions on when to stop/resume you Aspirin. If no instructions were given to you, call your surgeon's office.    7 days prior to surgery STOP taking any clopidogrel (PLAVIX) , Aspirin(unless otherwise instructed by your surgeon), Aleve, Naproxen, Ibuprofen, Motrin, Advil, Goody's, BC's, all herbal medications, fish oil, and all vitamins     Do not wear jewelry.  Do not wear lotions, powders, or colognes, or deodorant.  Men may shave face and neck.  Do not bring valuables to the hospital.  [facility] is not responsible for any belongings or valuables.  Contacts, dentures or bridgework may not be worn into surgery.  Leave your suitcase in the car.  After surgery it may be brought to your room.  For patients admitted to the hospital, discharge time will be determined by your treatment team.  Patients discharged the day of surgery will not be allowed to drive home.    [place]- Preparing For Surgery  Before surgery, you can play an important role. Because skin is not sterile, your skin needs to be as free of germs as possible. You can reduce the number of germs on your skin by washing with CHG (chlorahexidine gluconate) Soap before surgery.  CHG is an antiseptic cleaner which kills germs and bonds with the skin to continue killing germs even after washing.    Oral Hygiene is also important to reduce your risk  of infection.  Remember - BRUSH YOUR TEETH THE MORNING OF SURGERY WITH YOUR REGULAR TOOTHPASTE  Please do not use if you have an allergy to CHG or antibacterial soaps. If your skin becomes reddened/irritated stop using the CHG.  Do not shave (including legs and underarms) for at least 48 hours prior to first CHG shower. It is OK to shave your face.  Please follow these instructions carefully.   1. Shower the NIGHT BEFORE SURGERY and the MORNING OF SURGERY with CHG.   2. If you chose to wash your hair, wash your hair first as usual with your normal shampoo.  3. After you shampoo, rinse your hair and body thoroughly to remove the shampoo.  4. Use CHG as you would any other liquid soap. You can apply CHG directly to the skin and wash gently with a scrungie or a clean washcloth.   5. Apply the CHG Soap to your body ONLY FROM THE NECK DOWN.  Do not use on open wounds or open sores. Avoid contact with your eyes, ears, mouth and genitals (private parts). Wash Face and genitals (private parts)  with your normal soap.  6. Wash thoroughly, paying special attention to the area where your surgery will be performed.  7. Thoroughly rinse your body with warm water from the neck down.  8. DO NOT shower/wash with your normal soap after using and rinsing off the CHG Soap.  9. Pat yourself dry with a CLEAN TOWEL.  10. Wear CLEAN PAJAMAS to bed the night before surgery, wear comfortable clothes the morning of surgery  11. Place CLEAN SHEETS on your bed the night of your first shower and DO NOT SLEEP WITH PETS.    Day of Surgery:  Do not apply any deodorants/lotions.  Please wear clean clothes to the hospital/surgery center.   Remember to brush your teeth WITH YOUR REGULAR TOOTHPASTE.    Please read over the following fact sheets that you were given.

## 2018-04-26 ENCOUNTER — Encounter (HOSPITAL_COMMUNITY)
Admission: RE | Admit: 2018-04-26 | Discharge: 2018-04-26 | Disposition: A | Discharge status: Home / Self Care | Payer: BLUE CROSS/BLUE SHIELD | Source: Ambulatory Visit | Attending: Orthopaedic Surgery | Admitting: Orthopaedic Surgery

## 2018-04-26 ENCOUNTER — Telehealth (INDEPENDENT_AMBULATORY_CARE_PROVIDER_SITE_OTHER): Payer: Self-pay | Admitting: Orthopaedic Surgery

## 2018-04-26 ENCOUNTER — Encounter (HOSPITAL_COMMUNITY): Payer: Self-pay

## 2018-04-26 ENCOUNTER — Other Ambulatory Visit: Payer: Self-pay

## 2018-04-26 DIAGNOSIS — Z01818 Encounter for other preprocedural examination: Secondary | ICD-10-CM | POA: Insufficient documentation

## 2018-04-26 DIAGNOSIS — I10 Essential (primary) hypertension: Secondary | ICD-10-CM | POA: Diagnosis not present

## 2018-04-26 LAB — CBC
HEMATOCRIT: 38.7 % — AB (ref 39.0–52.0)
Hemoglobin: 13 g/dL (ref 13.0–17.0)
MCH: 29.5 pg (ref 26.0–34.0)
MCHC: 33.6 g/dL (ref 30.0–36.0)
MCV: 87.8 fL (ref 80.0–100.0)
NRBC: 0 % (ref 0.0–0.2)
Platelets: 175 10*3/uL (ref 150–400)
RBC: 4.41 MIL/uL (ref 4.22–5.81)
RDW: 12.4 % (ref 11.5–15.5)
WBC: 6.4 10*3/uL (ref 4.0–10.5)

## 2018-04-26 LAB — COMPREHENSIVE METABOLIC PANEL
ALT: 38 U/L (ref 0–44)
ANION GAP: 7 (ref 5–15)
AST: 40 U/L (ref 15–41)
Albumin: 3.8 g/dL (ref 3.5–5.0)
Alkaline Phosphatase: 76 U/L (ref 38–126)
BUN: 17 mg/dL (ref 6–20)
CO2: 25 mmol/L (ref 22–32)
CREATININE: 1.13 mg/dL (ref 0.61–1.24)
Calcium: 9.4 mg/dL (ref 8.9–10.3)
Chloride: 105 mmol/L (ref 98–111)
Glucose, Bld: 206 mg/dL — ABNORMAL HIGH (ref 70–99)
Potassium: 4.6 mmol/L (ref 3.5–5.1)
Sodium: 137 mmol/L (ref 135–145)
TOTAL PROTEIN: 6.2 g/dL — AB (ref 6.5–8.1)
Total Bilirubin: 1.7 mg/dL — ABNORMAL HIGH (ref 0.3–1.2)

## 2018-04-26 LAB — SURGICAL PCR SCREEN
MRSA, PCR: NEGATIVE
Staphylococcus aureus: NEGATIVE

## 2018-04-26 NOTE — Telephone Encounter (Signed)
Patient aware of the below message  

## 2018-04-26 NOTE — Telephone Encounter (Signed)
Not baby aspirin right?

## 2018-04-26 NOTE — Telephone Encounter (Signed)
He does not need to stop his baby aspirin from my standpoint, unless he was told to in his pre-op appointment at short-stay.

## 2018-04-26 NOTE — Progress Notes (Signed)
PCP - Vonita MossMark Crissman MD Cardiologist - Bernette RedbirdKevin Harrison MD  Chest x-ray - N/A EKG - 04/26/18 Stress Test - 2013 [facility] ECHO - 2013 [facility] Cardiac Cath - 2013 [facility]  Blood Thinner Instructions: Stopping plavix 7 days prior Aspirin Instructions: will call Dr. Eliberto IvoryBlackman's office.  Anesthesia review: Hx CAD. Requested docs from Dr. Romeo AppleHarrison.  Patient denies shortness of breath, fever, cough and chest pain at PAT appointment   Patient verbalized understanding of instructions that were given to them at the PAT appointment. Patient was also instructed that they will need to review over the PAT instructions again at home before surgery.

## 2018-04-26 NOTE — Telephone Encounter (Signed)
Patient called wanting to know if he needs to stop his 81mg  Aspirin 7 days prior to surgery.  CB#[phone].  Thank you.

## 2018-04-27 NOTE — Progress Notes (Signed)
Anesthesia Chart Review:  Case:  [id number] Date/Time:  05/10/18 0915   Procedure:  RIGHT TOTAL HIP ARTHROPLASTY ANTERIOR APPROACH (Right )   Anesthesia type:  Choice   Pre-op diagnosis:  osteoarthritis right hip   [facility] 07 / [facility]   Surgeon:  Kathryne Hitch, MD      DISCUSSION: 57 yo male former smoker. Pertinent hx includes GERD, HTN, CAD - MI with VF arrest 3v CAD s/p PCI in 2011 with a BMS to LAD for 100% occlusion and repeat PCI in 2013 with a DES to the LAD for an 80% stenosis within the prior BMS.  Follows with cardiology at [facility], Dr. Romeo Apple. Per his last OV note 03/17/2018 "Plan is to continue his current medications and add Amlodipine 5 mg QD for poorly controlled HTN. His blood pressure is elevated today (180/100). He will return for follow-up in 1 year and will be followed by Dr. Dossie Arbour for HTN in the meantime."  Cardiac clearance by Dr. Romeo Apple on pt chart stating "Stable from a cardiac standpoint when evaluated 03/17/2018."  Anticipate he can proceed as planned barring acute status change and BP acceptable on DOS. BP elevated at last cardiology visit and amlodipine was added. Unfortunately the pt's vitals were not recorded at his PAT appointment.    VS: Ht 6' (1.829 m)   Wt 122.5 kg   BMI 36.62 kg/m   PROVIDERS: Steele Sizer, MD is PCP  Janey Greaser, MD is Cardiologist  LABS: Labs reviewed: Acceptable for surgery. (all labs ordered are listed, but only abnormal results are displayed)  Labs Reviewed  CBC - Abnormal; Notable for the following components:      Result Value   HCT 38.7 (*)    All other components within normal limits  COMPREHENSIVE METABOLIC PANEL - Abnormal; Notable for the following components:   Glucose, Bld 206 (*)    Total Protein 6.2 (*)    Total Bilirubin 1.7 (*)    All other components within normal limits  SURGICAL PCR SCREEN     EKG: 04/26/2018: Normal sinus rhythm. Rate 77. Septal infarct , age  undetermined No significant change since last tracing 2015.  CV: Cath and PCI 03/23/2012 (care everywhere): Impressions and recommendations: 1.  Single-vessel obstructive coronary artery disease with evidence of restenosis within the stents in the mid LAD.  (Note: Angiographically of the ramus, second diagonal branch in the proximal right coronary artery looks less severe in terms of angiographic stenosis estimated on the 2009 catheterization.  Given the echo report of inferior ischemia we will proceed with FFR evaluation of the right coronary artery and plan repeat PCI of the LAD.)  PCI: Percutaneous coronary intervention performed of the 80% mid lesion within prior bare-metal vision stents.  Results: Good angiographic result.  Past Medical History:  Diagnosis Date  . Back pain   . CAD (coronary artery disease)   . Cervical spondylosis with myelopathy   . GERD (gastroesophageal reflux disease)   . H/O urinary frequency   . Hyperlipidemia   . Hypertension   . Myocardial infarction [facility]) 2011   caused VF arresr; s/p LAD bare metal stent; Sees Dr Romeo Apple @ [facility] s/p stent    Past Surgical History:  Procedure Laterality Date  . ANTERIOR CERVICAL DECOMP/DISCECTOMY FUSION N/A 01/08/2014   Procedure: ANTERIOR CERVICAL DECOMPRESSION/DISCECTOMY FUSION 2 LEVELS;  Surgeon: Tressie Stalker, MD;  Location: [facility];  Service: Neurosurgery;  Laterality: N/A;  Cervical Five-Six/Six-Seven Anterior Cervical Decompression  with Fusoin interbody prosthesis plating and bonegraft  . CORONARY ANGIOPLASTY  [id number]   [facility]Kevin Harrison [facility]  . TONSILLECTOMY    . WISDOM TOOTH EXTRACTION      MEDICATIONS: . acetaminophen-codeine (TYLENOL #3) 300-30 MG tablet  . amLODipine (NORVASC) 5 MG tablet  . aspirin EC 81 MG tablet  . atorvastatin (LIPITOR) 20 MG tablet  . carvedilol (COREG) 12.5 MG tablet  . clopidogrel (PLAVIX) 75 MG tablet  . HYDROcodone-acetaminophen (NORCO/VICODIN) 5-325 MG  tablet  . ibuprofen (ADVIL,MOTRIN) 200 MG tablet  . lisinopril (PRINIVIL,ZESTRIL) 40 MG tablet  . Lorcaserin HCl ER (BELVIQ XR) 20 MG TB24  . Omega-3 Fatty Acids (FISH OIL) 1200 MG CAPS   No current facility-administered medications for this encounter.      Zannie CoveJames , PA-C [facility]/Anesthesiology Phone [phone] 05/06/2018 10:52 AM

## 2018-05-03 ENCOUNTER — Telehealth (INDEPENDENT_AMBULATORY_CARE_PROVIDER_SITE_OTHER): Payer: Self-pay | Admitting: Orthopaedic Surgery

## 2018-05-03 ENCOUNTER — Other Ambulatory Visit (INDEPENDENT_AMBULATORY_CARE_PROVIDER_SITE_OTHER): Payer: Self-pay | Admitting: Orthopaedic Surgery

## 2018-05-03 MED ORDER — ACETAMINOPHEN-CODEINE #3 300-30 MG PO TABS: 1.0000 | ORAL_TABLET | Freq: Three times a day (TID) | ORAL | 0 refills | Status: DC | PRN

## 2018-05-03 NOTE — Telephone Encounter (Signed)
Patient called asked what can he take for pain prior to surgery 05/10/18? Patient said he is in a lot of pain. The number to contact patient is [phone]

## 2018-05-03 NOTE — Telephone Encounter (Signed)
Please advise 

## 2018-05-03 NOTE — Telephone Encounter (Signed)
Patient aware this was called in for him  

## 2018-05-03 NOTE — Telephone Encounter (Signed)
I sent in some tylenol#3. 

## 2018-05-05 ENCOUNTER — Other Ambulatory Visit (INDEPENDENT_AMBULATORY_CARE_PROVIDER_SITE_OTHER): Payer: Self-pay | Admitting: Orthopaedic Surgery

## 2018-05-05 ENCOUNTER — Ambulatory Visit (INDEPENDENT_AMBULATORY_CARE_PROVIDER_SITE_OTHER): Payer: BLUE CROSS/BLUE SHIELD | Admitting: Specialist

## 2018-05-05 DIAGNOSIS — M1611 Unilateral primary osteoarthritis, right hip: Secondary | ICD-10-CM

## 2018-05-06 NOTE — Anesthesia Preprocedure Evaluation (Addendum)
Anesthesia Evaluation  Patient identified by MRN, date of birth, ID band Patient awake    Reviewed: Allergy & Precautions, NPO status , Patient's Chart, lab work & pertinent test results  History of Anesthesia Complications Negative for: history of anesthetic complications  Airway Mallampati: II  TM Distance: >3 FB Neck ROM: Full    Dental no notable dental hx.    Pulmonary neg pulmonary ROS, former smoker,    Pulmonary exam normal        Cardiovascular hypertension, Pt. on medications and Pt. on home beta blockers + CAD and + Past MI  Normal cardiovascular exam+ dysrhythmias Ventricular Fibrillation   See note from Antionette PolesJames Burns on 04/26/18 for full cardiac hx   Neuro/Psych Lumbar stenosis negative psych ROS   GI/Hepatic Neg liver ROS, GERD  ,  Endo/Other  negative endocrine ROS  Renal/GU negative Renal ROS  negative genitourinary   Musculoskeletal  (+) Arthritis , Osteoarthritis,    Abdominal (+) + obese,   Peds  Hematology negative hematology ROS (+)   Anesthesia Other Findings 12M for R THA - HTN, CAD w/ hx of VF arrest (s/p PCI), GERD, lumbar stenosis - last dose Plavix 7 days ago  Reproductive/Obstetrics                           Anesthesia Physical Anesthesia Plan  ASA: III  Anesthesia Plan: Spinal   Post-op Pain Management:    Induction:   PONV Risk Score and Plan: 1 and Propofol infusion, TIVA and Treatment may vary due to age or medical condition  Airway Management Planned: Nasal Cannula and Simple Face Mask  Additional Equipment: None  Intra-op Plan:   Post-operative Plan:   Informed Consent: I have reviewed the patients History and Physical, chart, labs and discussed the procedure including the risks, benefits and alternatives for the proposed anesthesia with the patient or authorized representative who has indicated his/her understanding and acceptance.      Plan Discussed with:   Anesthesia Plan Comments: (See PAT note 04/26/2018 by Antionette PolesJames Burns, PA-C )       Anesthesia Quick Evaluation

## 2018-05-09 MED ORDER — DEXTROSE 5 % IV SOLN
3.0000 g | INTRAVENOUS | Status: AC
  Administered 2018-05-10: 2 g via INTRAVENOUS
  Filled 2018-05-09: qty 3

## 2018-05-09 MED ORDER — TRANEXAMIC ACID-NACL 1000-0.7 MG/100ML-% IV SOLN: 1000.0000 mg | INTRAVENOUS | Status: DC

## 2018-05-10 ENCOUNTER — Inpatient Hospital Stay (HOSPITAL_COMMUNITY): Payer: BLUE CROSS/BLUE SHIELD

## 2018-05-10 ENCOUNTER — Inpatient Hospital Stay (HOSPITAL_COMMUNITY): Payer: BLUE CROSS/BLUE SHIELD | Admitting: Physician Assistant

## 2018-05-10 ENCOUNTER — Inpatient Hospital Stay (HOSPITAL_COMMUNITY): Payer: BLUE CROSS/BLUE SHIELD | Admitting: Certified Registered Nurse Anesthetist

## 2018-05-10 ENCOUNTER — Encounter (HOSPITAL_COMMUNITY)
Admission: RE | Disposition: A | Discharge status: Home Health | Payer: Self-pay | Source: Home / Self Care | Attending: Orthopaedic Surgery

## 2018-05-10 ENCOUNTER — Other Ambulatory Visit: Payer: Self-pay

## 2018-05-10 ENCOUNTER — Encounter (HOSPITAL_COMMUNITY): Payer: Self-pay

## 2018-05-10 ENCOUNTER — Inpatient Hospital Stay (HOSPITAL_COMMUNITY)
Admission: RE | Admit: 2018-05-10 | Discharge: 2018-05-11 | DRG: 470 | Disposition: A | Discharge status: Home Health | Payer: BLUE CROSS/BLUE SHIELD | Attending: Orthopaedic Surgery | Admitting: Orthopaedic Surgery

## 2018-05-10 DIAGNOSIS — Z79899 Other long term (current) drug therapy: Secondary | ICD-10-CM

## 2018-05-10 DIAGNOSIS — I2510 Atherosclerotic heart disease of native coronary artery without angina pectoris: Secondary | ICD-10-CM | POA: Diagnosis present

## 2018-05-10 DIAGNOSIS — Z8249 Family history of ischemic heart disease and other diseases of the circulatory system: Secondary | ICD-10-CM

## 2018-05-10 DIAGNOSIS — Z8674 Personal history of sudden cardiac arrest: Secondary | ICD-10-CM

## 2018-05-10 DIAGNOSIS — Z7982 Long term (current) use of aspirin: Secondary | ICD-10-CM

## 2018-05-10 DIAGNOSIS — M1611 Unilateral primary osteoarthritis, right hip: Secondary | ICD-10-CM | POA: Diagnosis present

## 2018-05-10 DIAGNOSIS — E669 Obesity, unspecified: Secondary | ICD-10-CM | POA: Diagnosis present

## 2018-05-10 DIAGNOSIS — K219 Gastro-esophageal reflux disease without esophagitis: Secondary | ICD-10-CM | POA: Diagnosis present

## 2018-05-10 DIAGNOSIS — Z6835 Body mass index (BMI) 35.0-35.9, adult: Secondary | ICD-10-CM

## 2018-05-10 DIAGNOSIS — Z96641 Presence of right artificial hip joint: Secondary | ICD-10-CM

## 2018-05-10 DIAGNOSIS — E785 Hyperlipidemia, unspecified: Secondary | ICD-10-CM | POA: Diagnosis present

## 2018-05-10 DIAGNOSIS — Z7902 Long term (current) use of antithrombotics/antiplatelets: Secondary | ICD-10-CM | POA: Diagnosis not present

## 2018-05-10 DIAGNOSIS — Z9861 Coronary angioplasty status: Secondary | ICD-10-CM | POA: Diagnosis not present

## 2018-05-10 DIAGNOSIS — I10 Essential (primary) hypertension: Secondary | ICD-10-CM | POA: Diagnosis present

## 2018-05-10 DIAGNOSIS — I252 Old myocardial infarction: Secondary | ICD-10-CM | POA: Diagnosis not present

## 2018-05-10 DIAGNOSIS — M25551 Pain in right hip: Secondary | ICD-10-CM | POA: Diagnosis present

## 2018-05-10 DIAGNOSIS — Z419 Encounter for procedure for purposes other than remedying health state, unspecified: Secondary | ICD-10-CM

## 2018-05-10 DIAGNOSIS — Z981 Arthrodesis status: Secondary | ICD-10-CM | POA: Diagnosis not present

## 2018-05-10 DIAGNOSIS — Z87891 Personal history of nicotine dependence: Secondary | ICD-10-CM | POA: Diagnosis not present

## 2018-05-10 HISTORY — PX: TOTAL HIP ARTHROPLASTY: SHX124

## 2018-05-10 HISTORY — DX: Unspecified osteoarthritis, unspecified site: M19.90

## 2018-05-10 SURGERY — ARTHROPLASTY, HIP, TOTAL, ANTERIOR APPROACH
Anesthesia: Spinal | Laterality: Right

## 2018-05-10 MED ORDER — PROPOFOL 500 MG/50ML IV EMUL
INTRAVENOUS | Status: DC | PRN
  Administered 2018-05-10: 80 ug/kg/min via INTRAVENOUS

## 2018-05-10 MED ORDER — OXYCODONE HCL 5 MG PO TABS
5.0000 mg | ORAL_TABLET | ORAL | Status: DC | PRN
  Administered 2018-05-10 (×2): 10 mg via ORAL
  Administered 2018-05-11 (×2): 5 mg via ORAL
  Administered 2018-05-11: 10 mg via ORAL
  Filled 2018-05-10: qty 1
  Filled 2018-05-10 (×2): qty 2

## 2018-05-10 MED ORDER — DIPHENHYDRAMINE HCL 12.5 MG/5ML PO ELIX: 12.5000 mg | ORAL_SOLUTION | ORAL | Status: DC | PRN

## 2018-05-10 MED ORDER — PHENOL 1.4 % MT LIQD: 1.0000 | OROMUCOSAL | Status: DC | PRN

## 2018-05-10 MED ORDER — ONDANSETRON HCL 4 MG PO TABS: 4.0000 mg | ORAL_TABLET | Freq: Four times a day (QID) | ORAL | Status: DC | PRN

## 2018-05-10 MED ORDER — DOCUSATE SODIUM 100 MG PO CAPS
100.0000 mg | ORAL_CAPSULE | Freq: Two times a day (BID) | ORAL | Status: DC
  Administered 2018-05-10 – 2018-05-11 (×2): 100 mg via ORAL
  Filled 2018-05-10 (×2): qty 1

## 2018-05-10 MED ORDER — LISINOPRIL 40 MG PO TABS
40.0000 mg | ORAL_TABLET | Freq: Every day | ORAL | Status: DC
  Administered 2018-05-11: 40 mg via ORAL
  Filled 2018-05-10: qty 1

## 2018-05-10 MED ORDER — CEFAZOLIN SODIUM-DEXTROSE 1-4 GM/50ML-% IV SOLN
INTRAVENOUS | Status: AC
  Filled 2018-05-10: qty 50

## 2018-05-10 MED ORDER — OXYCODONE HCL 5 MG PO TABS
ORAL_TABLET | ORAL | Status: AC
  Administered 2018-05-10: 10 mg
  Filled 2018-05-10: qty 1

## 2018-05-10 MED ORDER — OXYCODONE HCL 5 MG PO TABS
10.0000 mg | ORAL_TABLET | ORAL | Status: DC | PRN
  Filled 2018-05-10: qty 3
  Filled 2018-05-10: qty 2

## 2018-05-10 MED ORDER — METHOCARBAMOL 500 MG PO TABS
ORAL_TABLET | ORAL | Status: AC
  Filled 2018-05-10: qty 1

## 2018-05-10 MED ORDER — ONDANSETRON HCL 4 MG/2ML IJ SOLN: 4.0000 mg | Freq: Four times a day (QID) | INTRAMUSCULAR | Status: DC | PRN

## 2018-05-10 MED ORDER — SODIUM CHLORIDE 0.9 % IR SOLN
Status: DC | PRN
  Administered 2018-05-10: 3000 mL

## 2018-05-10 MED ORDER — ACETAMINOPHEN 325 MG PO TABS: 325.0000 mg | ORAL_TABLET | Freq: Four times a day (QID) | ORAL | Status: DC | PRN

## 2018-05-10 MED ORDER — MIDAZOLAM HCL 2 MG/2ML IJ SOLN
INTRAMUSCULAR | Status: AC
  Filled 2018-05-10: qty 2

## 2018-05-10 MED ORDER — MIDAZOLAM HCL 5 MG/5ML IJ SOLN
INTRAMUSCULAR | Status: DC | PRN
  Administered 2018-05-10: 2 mg via INTRAVENOUS

## 2018-05-10 MED ORDER — FENTANYL CITRATE (PF) 250 MCG/5ML IJ SOLN
INTRAMUSCULAR | Status: AC
  Filled 2018-05-10: qty 5

## 2018-05-10 MED ORDER — METOCLOPRAMIDE HCL 5 MG/ML IJ SOLN: 5.0000 mg | Freq: Three times a day (TID) | INTRAMUSCULAR | Status: DC | PRN

## 2018-05-10 MED ORDER — HYDROMORPHONE HCL 1 MG/ML IJ SOLN: 0.5000 mg | INTRAMUSCULAR | Status: DC | PRN

## 2018-05-10 MED ORDER — ONDANSETRON HCL 4 MG/2ML IJ SOLN: 4.0000 mg | Freq: Once | INTRAMUSCULAR | Status: DC | PRN

## 2018-05-10 MED ORDER — ALUM & MAG HYDROXIDE-SIMETH 200-200-20 MG/5ML PO SUSP
30.0000 mL | ORAL | Status: DC | PRN
  Administered 2018-05-11: 30 mL via ORAL
  Filled 2018-05-10: qty 30

## 2018-05-10 MED ORDER — ONDANSETRON HCL 4 MG/2ML IJ SOLN
INTRAMUSCULAR | Status: AC
  Filled 2018-05-10: qty 2

## 2018-05-10 MED ORDER — PANTOPRAZOLE SODIUM 40 MG PO TBEC
40.0000 mg | DELAYED_RELEASE_TABLET | Freq: Every day | ORAL | Status: DC
  Administered 2018-05-10 – 2018-05-11 (×2): 40 mg via ORAL
  Filled 2018-05-10 (×2): qty 1

## 2018-05-10 MED ORDER — CEFAZOLIN SODIUM-DEXTROSE 1-4 GM/50ML-% IV SOLN
1.0000 g | Freq: Four times a day (QID) | INTRAVENOUS | Status: AC
  Administered 2018-05-10 (×2): 1 g via INTRAVENOUS
  Filled 2018-05-10: qty 50

## 2018-05-10 MED ORDER — DEXAMETHASONE SODIUM PHOSPHATE 10 MG/ML IJ SOLN
INTRAMUSCULAR | Status: AC
  Filled 2018-05-10: qty 1

## 2018-05-10 MED ORDER — ASPIRIN EC 81 MG PO TBEC
81.0000 mg | DELAYED_RELEASE_TABLET | Freq: Every day | ORAL | Status: DC
  Administered 2018-05-11: 81 mg via ORAL
  Filled 2018-05-10: qty 1

## 2018-05-10 MED ORDER — MENTHOL 3 MG MT LOZG: 1.0000 | LOZENGE | OROMUCOSAL | Status: DC | PRN

## 2018-05-10 MED ORDER — SODIUM CHLORIDE 0.9 % IV SOLN
INTRAVENOUS | Status: DC | PRN
  Administered 2018-05-10: 30 ug/min via INTRAVENOUS

## 2018-05-10 MED ORDER — POLYETHYLENE GLYCOL 3350 17 G PO PACK: 17.0000 g | PACK | Freq: Every day | ORAL | Status: DC | PRN

## 2018-05-10 MED ORDER — OXYCODONE HCL 5 MG/5ML PO SOLN: 5.0000 mg | Freq: Once | ORAL | Status: AC | PRN

## 2018-05-10 MED ORDER — BUPIVACAINE IN DEXTROSE 0.75-8.25 % IT SOLN
INTRATHECAL | Status: DC | PRN
  Administered 2018-05-10: 2 mL via INTRATHECAL

## 2018-05-10 MED ORDER — EPHEDRINE SULFATE-NACL 50-0.9 MG/10ML-% IV SOSY
PREFILLED_SYRINGE | INTRAVENOUS | Status: DC | PRN
  Administered 2018-05-10 (×5): 5 mg via INTRAVENOUS

## 2018-05-10 MED ORDER — OXYCODONE HCL 5 MG PO TABS
5.0000 mg | ORAL_TABLET | Freq: Once | ORAL | Status: AC | PRN
  Administered 2018-05-10: 5 mg via ORAL

## 2018-05-10 MED ORDER — CLOPIDOGREL BISULFATE 75 MG PO TABS
75.0000 mg | ORAL_TABLET | Freq: Every day | ORAL | Status: DC
  Administered 2018-05-10 – 2018-05-11 (×2): 75 mg via ORAL
  Filled 2018-05-10 (×2): qty 1

## 2018-05-10 MED ORDER — METOCLOPRAMIDE HCL 5 MG PO TABS: 5.0000 mg | ORAL_TABLET | Freq: Three times a day (TID) | ORAL | Status: DC | PRN

## 2018-05-10 MED ORDER — ONDANSETRON HCL 4 MG/2ML IJ SOLN
INTRAMUSCULAR | Status: DC | PRN
  Administered 2018-05-10: 4 mg via INTRAVENOUS

## 2018-05-10 MED ORDER — SODIUM CHLORIDE 0.9 % IV SOLN
INTRAVENOUS | Status: DC
  Administered 2018-05-10: 16:00:00 via INTRAVENOUS

## 2018-05-10 MED ORDER — DEXAMETHASONE SODIUM PHOSPHATE 10 MG/ML IJ SOLN
INTRAMUSCULAR | Status: DC | PRN
  Administered 2018-05-10: 10 mg via INTRAVENOUS

## 2018-05-10 MED ORDER — LACTATED RINGERS IV SOLN
INTRAVENOUS | Status: DC
  Administered 2018-05-10 (×3): via INTRAVENOUS

## 2018-05-10 MED ORDER — METHOCARBAMOL 1000 MG/10ML IJ SOLN
500.0000 mg | Freq: Four times a day (QID) | INTRAVENOUS | Status: DC | PRN
  Filled 2018-05-10: qty 5

## 2018-05-10 MED ORDER — PHENYLEPHRINE 40 MCG/ML (10ML) SYRINGE FOR IV PUSH (FOR BLOOD PRESSURE SUPPORT)
PREFILLED_SYRINGE | INTRAVENOUS | Status: DC | PRN
  Administered 2018-05-10: 120 ug via INTRAVENOUS
  Administered 2018-05-10: 80 ug via INTRAVENOUS

## 2018-05-10 MED ORDER — FENTANYL CITRATE (PF) 100 MCG/2ML IJ SOLN: 25.0000 ug | INTRAMUSCULAR | Status: DC | PRN

## 2018-05-10 MED ORDER — CARVEDILOL 12.5 MG PO TABS
12.5000 mg | ORAL_TABLET | Freq: Two times a day (BID) | ORAL | Status: DC
  Administered 2018-05-10 – 2018-05-11 (×2): 12.5 mg via ORAL
  Filled 2018-05-10 (×2): qty 1

## 2018-05-10 MED ORDER — INFLUENZA VAC SPLIT QUAD 0.5 ML IM SUSY: 0.5000 mL | PREFILLED_SYRINGE | INTRAMUSCULAR | Status: DC

## 2018-05-10 MED ORDER — ZOLPIDEM TARTRATE 5 MG PO TABS: 5.0000 mg | ORAL_TABLET | Freq: Every evening | ORAL | Status: DC | PRN

## 2018-05-10 MED ORDER — 0.9 % SODIUM CHLORIDE (POUR BTL) OPTIME
TOPICAL | Status: DC | PRN
  Administered 2018-05-10: 1000 mL

## 2018-05-10 MED ORDER — ATORVASTATIN CALCIUM 10 MG PO TABS
20.0000 mg | ORAL_TABLET | Freq: Every day | ORAL | Status: DC
  Administered 2018-05-10: 20 mg via ORAL
  Filled 2018-05-10 (×2): qty 2

## 2018-05-10 MED ORDER — AMLODIPINE BESYLATE 5 MG PO TABS
5.0000 mg | ORAL_TABLET | Freq: Every day | ORAL | Status: DC
  Administered 2018-05-11: 5 mg via ORAL
  Filled 2018-05-10: qty 1

## 2018-05-10 MED ORDER — PROPOFOL 10 MG/ML IV BOLUS
INTRAVENOUS | Status: DC | PRN
  Administered 2018-05-10: 20 mg via INTRAVENOUS

## 2018-05-10 MED ORDER — METHOCARBAMOL 500 MG PO TABS
500.0000 mg | ORAL_TABLET | Freq: Four times a day (QID) | ORAL | Status: DC | PRN
  Administered 2018-05-10: 500 mg via ORAL

## 2018-05-10 SURGICAL SUPPLY — 51 items
BENZOIN TINCTURE PRP APPL 2/3 (GAUZE/BANDAGES/DRESSINGS) ×3 IMPLANT
BLADE CLIPPER SURG (BLADE) IMPLANT
BLADE SAW SGTL 18X1.27X75 (BLADE) ×2 IMPLANT
BLADE SAW SGTL 18X1.27X75MM (BLADE) ×1
CLOSURE WOUND 1/2 X4 (GAUZE/BANDAGES/DRESSINGS) ×1
COVER SURGICAL LIGHT HANDLE (MISCELLANEOUS) ×3 IMPLANT
CUP SECTOR GRIPTON 58MM (Orthopedic Implant) ×3 IMPLANT
DRAPE C-ARM 42X72 X-RAY (DRAPES) ×3 IMPLANT
DRAPE STERI IOBAN 125X83 (DRAPES) ×3 IMPLANT
DRAPE U-SHAPE 47X51 STRL (DRAPES) ×3 IMPLANT
DRSG AQUACEL AG ADV 3.5X10 (GAUZE/BANDAGES/DRESSINGS) ×3 IMPLANT
DURAPREP 26ML APPLICATOR (WOUND CARE) ×3 IMPLANT
ELECT BLADE 4.0 EZ CLEAN MEGAD (MISCELLANEOUS) ×3
ELECT BLADE 6.5 EXT (BLADE) IMPLANT
ELECT REM PT RETURN 9FT ADLT (ELECTROSURGICAL) ×3
ELECTRODE BLDE 4.0 EZ CLN MEGD (MISCELLANEOUS) ×1 IMPLANT
ELECTRODE REM PT RTRN 9FT ADLT (ELECTROSURGICAL) ×1 IMPLANT
FACESHIELD WRAPAROUND (MASK) ×9 IMPLANT
GLOVE BIOGEL PI IND STRL 8 (GLOVE) ×2 IMPLANT
GLOVE BIOGEL PI INDICATOR 8 (GLOVE) ×4
GLOVE ECLIPSE 8.0 STRL XLNG CF (GLOVE) ×3 IMPLANT
GLOVE ORTHO TXT STRL SZ7.5 (GLOVE) ×6 IMPLANT
GOWN STRL REUS W/ TWL LRG LVL3 (GOWN DISPOSABLE) ×2 IMPLANT
GOWN STRL REUS W/ TWL XL LVL3 (GOWN DISPOSABLE) ×2 IMPLANT
GOWN STRL REUS W/TWL LRG LVL3 (GOWN DISPOSABLE) ×4
GOWN STRL REUS W/TWL XL LVL3 (GOWN DISPOSABLE) ×4
HANDPIECE INTERPULSE COAX TIP (DISPOSABLE) ×2
HEAD CERAMIC DELTA 36 PLUS 1.5 (Hips) ×3 IMPLANT
KIT BASIN OR (CUSTOM PROCEDURE TRAY) ×3 IMPLANT
KIT TURNOVER KIT B (KITS) ×3 IMPLANT
LINER NEUTRAL 52X36X58N (Liner) ×3 IMPLANT
MANIFOLD NEPTUNE II (INSTRUMENTS) ×3 IMPLANT
NS IRRIG 1000ML POUR BTL (IV SOLUTION) ×3 IMPLANT
PACK TOTAL JOINT (CUSTOM PROCEDURE TRAY) ×3 IMPLANT
PAD ARMBOARD 7.5X6 YLW CONV (MISCELLANEOUS) ×3 IMPLANT
SET HNDPC FAN SPRY TIP SCT (DISPOSABLE) ×1 IMPLANT
STAPLER VISISTAT 35W (STAPLE) IMPLANT
STEM CORAIL (Stem) ×3 IMPLANT
STRIP CLOSURE SKIN 1/2X4 (GAUZE/BANDAGES/DRESSINGS) ×2 IMPLANT
SUT ETHIBOND NAB CT1 #1 30IN (SUTURE) ×6 IMPLANT
SUT MNCRL AB 4-0 PS2 18 (SUTURE) ×3 IMPLANT
SUT VIC AB 0 CT1 27 (SUTURE) ×4
SUT VIC AB 0 CT1 27XBRD ANBCTR (SUTURE) ×2 IMPLANT
SUT VIC AB 1 CT1 27 (SUTURE) ×4
SUT VIC AB 1 CT1 27XBRD ANBCTR (SUTURE) ×2 IMPLANT
SUT VIC AB 2-0 CT1 27 (SUTURE) ×4
SUT VIC AB 2-0 CT1 TAPERPNT 27 (SUTURE) ×2 IMPLANT
TAPE STRIPS DRAPE STRL (GAUZE/BANDAGES/DRESSINGS) ×3 IMPLANT
TOWEL OR 17X24 6PK STRL BLUE (TOWEL DISPOSABLE) ×3 IMPLANT
TOWEL OR 17X26 10 PK STRL BLUE (TOWEL DISPOSABLE) ×3 IMPLANT
TRAY FOLEY CATH 14FR (SET/KITS/TRAYS/PACK) ×3 IMPLANT

## 2018-05-10 NOTE — Transfer of Care (Addendum)
Immediate Anesthesia Transfer of Care Note  Patient: [name]  Procedure(s) Performed: RIGHT TOTAL HIP ARTHROPLASTY ANTERIOR APPROACH (Right )  Patient Location: PACU  Anesthesia Type:Spinal  Level of Consciousness: awake, alert  and oriented  Airway & Oxygen Therapy: Patient Spontanous Breathing and Patient connected to face mask oxygen  Post-op Assessment: Report given to RN and Post -op Vital signs reviewed and unstable, Anesthesiologist notified of hypotension  Post vital signs: Reviewed and stable. 5 mg Ephedrine administered.  Last Vitals:  Vitals Value Taken Time  BP 106/58 05/10/2018 10:57 AM  Temp    Pulse 75 05/10/2018 10:58 AM  Resp 27 05/10/2018 10:58 AM  SpO2 95 % 05/10/2018 10:58 AM  Vitals shown include unvalidated device data.  Last Pain:  Vitals:   05/10/18 0828  TempSrc:   PainSc: 0-No pain         Complications: No apparent anesthesia complications

## 2018-05-10 NOTE — Anesthesia Postprocedure Evaluation (Signed)
Anesthesia Post Note  Patient: Eddie Alexander  Procedure(s) Performed: RIGHT TOTAL HIP ARTHROPLASTY ANTERIOR APPROACH (Right )     Patient location during evaluation: PACU Anesthesia Type: Spinal Level of consciousness: oriented and awake and alert Pain management: pain level controlled Vital Signs Assessment: post-procedure vital signs reviewed and stable Respiratory status: spontaneous breathing, respiratory function stable and nonlabored ventilation Cardiovascular status: blood pressure returned to baseline and stable Postop Assessment: no headache, no backache, no apparent nausea or vomiting and spinal receding Anesthetic complications: no    Last Vitals:  Vitals:   05/10/18 1425 05/10/18 1430  BP: (!) 156/86   Pulse:  90  Resp:  20  Temp:    SpO2:  98%    Last Pain:  Vitals:   05/10/18 1430  TempSrc:   PainSc: 0-No pain                 Lucretia Kernarolyn E [place] Kappes

## 2018-05-10 NOTE — Evaluation (Signed)
Physical Therapy Evaluation Patient Details Name: Eddie Alexander MRN: [id number] DOB: [birth year] Today's Date: 05/10/2018   History of Present Illness  Pt is a 57 y.o. male s/p R THA (direct anterior approach) on 05/10/18. PMH includes HTN, CAD, cervical spondylosis (s/p ACDF C5-7), back pain.    Clinical Impression  Pt presents with an overall decrease in functional mobility secondary to above. PTA, pt indep and lives with family; wife will be able to provide initial 24/7 support upon return home. Educ on precautions, positioning, therex, and importance of mobility. Today, pt able to transfer and ambulate with supervision to min guard; overall, moving very well. Pt would benefit from continued acute PT services to maximize functional mobility and independence prior to d/c home.     Follow Up Recommendations Follow surgeon's recommendation for DC plan and follow-up therapies;Supervision for mobility/OOB    Equipment Recommendations  3in1 (PT)    Recommendations for Other Services       Precautions / Restrictions Precautions Precautions: Fall Restrictions Weight Bearing Restrictions: Yes RLE Weight Bearing: Weight bearing as tolerated      Mobility  Bed Mobility Overal bed mobility: Needs Assistance Bed Mobility: Supine to Sit     Supine to sit: Min assist     General bed mobility comments: MinA to assist RLE to EOB; educ on technique to use BUE or LLE support  Transfers Overall transfer level: Needs assistance Equipment used: Rolling walker (2 wheeled) Transfers: Sit to/from Stand Sit to Stand: Min guard;From elevated surface         General transfer comment: Cues for hand placement  Ambulation/Gait Ambulation/Gait assistance: Supervision Gait Distance (Feet): 100 Feet Assistive device: Rolling walker (2 wheeled) Gait Pattern/deviations: Step-through pattern;Decreased stride length;Decreased weight shift to right Gait velocity: Decreased Gait velocity  interpretation: 1.31 - 2.62 ft/sec, indicative of limited community ambulator General Gait Details: Slow, steady amb with RW and supervision for safety; cues for increased RLE WBAT as able  Stairs            Wheelchair Mobility    Modified Rankin (Stroke Patients Only)       Balance Overall balance assessment: Needs assistance   Sitting balance-Leahy Scale: Fair       Standing balance-Leahy Scale: Fair Standing balance comment: Can static stand without UE support                             Pertinent Vitals/Pain Pain Assessment: Faces Faces Pain Scale: Hurts a little bit Pain Location: R hip Pain Descriptors / Indicators: Sore Pain Intervention(s): Monitored during session;Limited activity within patient's tolerance    Home Living Family/patient expects to be discharged to:: Private residence Living Arrangements: Spouse/significant other;Children Available Help at Discharge: Family;Available 24 hours/day Type of Home: House Home Access: Stairs to enter Entrance Stairs-Rails: Right Entrance Stairs-Number of Steps: 3 Home Layout: Two level;Bed/bath upstairs;1/2 bath on main level Home Equipment: Walker - 2 wheels      Prior Function Level of Independence: Independent               Hand Dominance        Extremity/Trunk Assessment   Upper Extremity Assessment Upper Extremity Assessment: Overall WFL for tasks assessed    Lower Extremity Assessment Lower Extremity Assessment: RLE deficits/detail RLE Deficits / Details: Hip flex >3/5, knee flex/ext >3/5 RLE: Unable to fully assess due to pain       Communication   Communication: No difficulties  Cognition Arousal/Alertness: Awake/alert Behavior During Therapy: WFL for tasks assessed/performed Overall Cognitive Status: Within Functional Limits for tasks assessed                                        General Comments General comments (skin integrity, edema, etc.):  Wife present and supportive    Exercises     Assessment/Plan    PT Assessment Patient needs continued PT services  PT Problem List Decreased strength;Decreased range of motion;Decreased activity tolerance;Decreased balance;Decreased mobility;Decreased knowledge of use of DME;Decreased knowledge of precautions       PT Treatment Interventions DME instruction;Gait training;Stair training;Functional mobility training;Therapeutic activities;Therapeutic exercise;Balance training;Patient/family education    PT Goals (Current goals can be found in the Care Plan section)  Acute Rehab PT Goals Patient Stated Goal: Return home tomorrow PT Goal Formulation: With patient Time For Goal Achievement: 05/24/18 Potential to Achieve Goals: Good    Frequency 7X/week   Barriers to discharge        Co-evaluation               AM-PAC PT "6 Clicks" Mobility  Outcome Measure Help needed turning from your back to your side while in a flat bed without using bedrails?: None Help needed moving from lying on your back to sitting on the side of a flat bed without using bedrails?: A Little Help needed moving to and from a bed to a chair (including a wheelchair)?: A Little Help needed standing up from a chair using your arms (e.g., wheelchair or bedside chair)?: A Little Help needed to walk in hospital room?: A Little Help needed climbing 3-5 steps with a railing? : A Little 6 Click Score: 19    End of Session Equipment Utilized During Treatment: Gait belt Activity Tolerance: Patient tolerated treatment well Patient left: in chair;with call bell/phone within reach;with family/visitor present Nurse Communication: Mobility status PT Visit Diagnosis: Other abnormalities of gait and mobility (R26.89);Pain Pain - Right/Left: Right Pain - part of body: Hip    Time: [id number] PT Time Calculation (min) (ACUTE ONLY): 12 min   Charges:   PT Evaluation $PT Eval Low Complexity: 1 Low         Ina HomesJaclyn Tredarius Cobern, PT, DPT Acute Rehabilitation Services  Pager [phone] Office [phone]  Malachy ChamberJaclyn L Rochester Serpe 05/10/2018, 5:51 PM

## 2018-05-10 NOTE — Brief Op Note (Signed)
05/10/2018  10:40 AM  PATIENT:  [name]  57 y.o. male  PRE-OPERATIVE DIAGNOSIS:  osteoarthritis right hip  POST-OPERATIVE DIAGNOSIS:  osteoarthritis right hip  PROCEDURE:  Procedure(s): RIGHT TOTAL HIP ARTHROPLASTY ANTERIOR APPROACH (Right)  SURGEON:  Surgeon(s) and Role:    Kathryne Hitch* Makel Mcmann Y, MD - Primary  PHYSICIAN ASSISTANT: Rexene EdisonGil Clark, PA-C  ANESTHESIA:   spinal  EBL:  300 mL   COUNTS:  YES  PLAN OF CARE: Admit to inpatient   PATIENT DISPOSITION:  PACU - hemodynamically stable.   Delay start of Pharmacological VTE agent (>24hrs) due to surgical blood loss or risk of bleeding: no

## 2018-05-10 NOTE — Op Note (Signed)
NAME: Eddie Alexander, ROSTRO MEDICAL RECORD ZO:[id number] ACCOUNT [id number] DATE OF BIRTH:08-25-1960 FACILITY: [facility] LOCATION: [facility] PHYSICIAN:CHRISTOPHER Aretha Parrot, MD  OPERATIVE REPORT  DATE OF PROCEDURE:  05/10/2018  PREOPERATIVE DIAGNOSIS:  Primary osteoarthritis and degenerative joint disease, right hip.  POSTOPERATIVE DIAGNOSIS:  Primary osteoarthritis and degenerative joint disease, right hip.  PROCEDURE:  Right total hip arthroplasty through direct anterior approach.  IMPLANTS:  DePuy Sector Gription acetabular component size 58, size 36+0 neutral polyethylene liner, size 13 Corail femoral component with varus offset, size 36+1.5 ceramic hip ball.  SURGEON:  Vanita Panda. Magnus Ivan, MD  ASSISTANT:  Richardean Canal, PA-C.  ANESTHESIA:  Spinal.  ANTIBIOTICS:  Two grams IV Ancef.  ESTIMATED BLOOD LOSS:  300 mL  COMPLICATIONS:  None.  INDICATIONS:  The patient is a very pleasant 57 year old gentleman well known to me.  He is significantly obese, but his BMI is about 35.  He has severe arthritis of his right hip.  His x-rays show complete loss of joint space.  His pain is daily and it  is detrimentally affecting his activities of daily living, his quality of life and his mobility.  He let me know that this has been slowly worsening over 4 years now.  He has been off of anti-inflammatories for a week and is miserable at this point.  His  pain is daily and has detrimentally affected his mobility his quality of life and his activities of daily living to the point he does wish to proceed with a total hip arthroplasty.  We had a long and thorough discussion about the risk of acute blood  loss anemia, nerve or vessel injury, fracture, infection, dislocation, DVT and implant failure.  He understands our goals are to decrease pain, improve mobility and overall improve quality of life.  DESCRIPTION OF PROCEDURE:  After informed consent was obtained and appropriate right hip was marked.   He was brought to the operating room, sat up on his stretcher where spinal anesthesia was obtained.  He was then laid in the supine position on a  stretcher.  Foley catheter was placed and both feet had traction boots applied to them.  I did assess his leg length and he is just a touch short preoperative on that right painful hip.  We then next placed him supine on the Hana operative table with a  perineal post in place and both legs in line skeletal traction device and no traction applied.  His right operative hip was prepped and draped with DuraPrep and sterile drapes.  A time-out was called to identify correct patient, correct right hip.  We  then made an incision just inferior and posterior to the anterior superior iliac spine and carried this obliquely down the leg.  We dissected down tensor fascia lata muscle.  Tensor fascia was then divided longitudinally to proceed with direct anterior  approach to the hip, identified and cauterized circumflex vessels.  I then identified the hip capsule.  I entered the hip capsule in an L-type format, finding moderate joint effusion and significant periarticular osteophytes around the femoral head and  neck.  I placed Cobra retractors around the medial and lateral femoral neck and then made our femoral neck cut with an oscillating saw just proximal to the lesser trochanter and completed this with an osteotome.  We placed a corkscrew guide in the  femoral head and removed the femoral head in its entirety and found a wide area devoid of cartilage.  I then placed a bent Hohmann over  the medial acetabular rim and removed remnants of the acetabular labrum and other debris.  I then began reaming under  direct visualization from a size 44 reamer in stepwise increments all the way up to a size 57 with all reamers under direct visualization, the last reamer under direct fluoroscopy, so we could obtain our depth of reaming, our inclination and anteversion.   I then was able to  place the real DePuy Sector Gription acetabular component size 58 and a 36+0 neutral polyethylene liner for that size acetabular component.  Attention was then turned to the femur.  With the leg externally rotated to 120 degrees,  extended and adducted, we were able to place a Mueller retractor medially and Hohman retractor behind the greater trochanter.  I released lateral joint capsule and used a box-cutting osteotome to enter femoral canal and a rongeur to lateralize then began  broaching from a size 8 broach using Corail broaching system going up to a size 13.  With a size 13 in place, we tried a varus offset femoral neck and a 36+1.5 hip ball.  We rolled the leg back over and up and traction, internal rotation, reducing the  pelvis.  I was pleased with stability.  On physical exam, his rotation, leg length, offset were also assessed under direct fluoroscopy and found it to be close to equal.  We then dislocated the hip and removed the trial components.  I placed the real  Corail femoral component with varus offset and the real 36+1.5 ceramic hip ball again reducing the acetabulum and I was pleased with stability, range of motion, leg length and offset based on clinical exam and fluoroscopy.  We then irrigated the soft  tissue with normal saline solution.  I closed the joint capsule with interrupted #1 Ethibond suture, followed by running 0 Vicryl and tensor fascia, 0 Vicryl in the deep tissue, 2-0 Vicryl subcutaneous tissue and interrupted staples on the skin.   Xeroform and Aquacel dressing was applied.  He was taken off the Hana table and taken to recovery room in stable condition.  All final counts were correct.  There were no complications noted.  Of note, Rexene EdisonGil Clark, PA-C, assisted the entire case.  His  assistance was crucial for facilitating all aspects of this case.  TN/NUANCE  D:05/10/2018 T:05/10/2018 JOB:[id number]

## 2018-05-10 NOTE — Anesthesia Procedure Notes (Signed)
Spinal  Patient location during procedure: OR Staffing Anesthesiologist: Lucretia KernWitman, Rita Vialpando E, MD Performed: anesthesiologist  Preanesthetic Checklist Completed: patient identified, surgical consent, pre-op evaluation, timeout performed, IV checked, risks and benefits discussed and monitors and equipment checked Spinal Block Patient position: sitting Prep: site prepped and draped and DuraPrep Patient monitoring: continuous pulse ox, blood pressure and heart rate Approach: midline Location: L3-4 Injection technique: single-shot Needle Needle type: Pencan  Needle gauge: 24 G Needle length: 9 cm Additional Notes Functioning IV was confirmed and monitors were applied. Sterile prep and drape, including hand hygiene and sterile gloves were used. The patient was positioned and the spine was prepped. The skin was anesthetized with lidocaine.  Free flow of clear CSF was obtained prior to injecting local anesthetic into the CSF. The needle was carefully withdrawn. The patient tolerated the procedure well.   Single attempt, atraumatic. Clear CSF aspirated.

## 2018-05-10 NOTE — H&P (Signed)
TOTAL HIP ADMISSION H&P  Patient is admitted for right total hip arthroplasty.  Subjective:  Chief Complaint: right hip pain  HPI: Eddie Alexander, 57 y.o. male, has a history of pain and functional disability in the right hip(s) due to arthritis and patient has failed non-surgical conservative treatments for greater than 12 weeks to include NSAID's and/or analgesics, corticosteriod injections, flexibility and strengthening excercises, use of assistive devices and activity modification.  Onset of symptoms was gradual starting 4 years ago with gradually worsening course since that time.The patient noted no past surgery on the right hip(s).  Patient currently rates pain in the right hip at 10 out of 10 with activity. Patient has night pain, worsening of pain with activity and weight bearing, trendelenberg gait, pain that interfers with activities of daily living and pain with passive range of motion. Patient has evidence of subchondral cysts, subchondral sclerosis, periarticular osteophytes and joint space narrowing by imaging studies. This condition presents safety issues increasing the risk of falls.  There is no current active infection.  Patient Active Problem List   Diagnosis Date Noted  . Unilateral primary osteoarthritis, right hip 04/20/2018  . Class 2 obesity due to excess calories with serious comorbidity and body mass index (BMI) of 37.0 to 37.9 in adult 03/27/2016  . IFG (impaired fasting glucose) 01/24/2016  . Hypertension   . Hyperlipidemia   . CAD (coronary artery disease)   . Cervical spondylosis with myelopathy and radiculopathy 01/08/2014   Past Medical History:  Diagnosis Date  . Back pain   . CAD (coronary artery disease)   . Cervical spondylosis with myelopathy   . GERD (gastroesophageal reflux disease)   . H/O urinary frequency   . Hyperlipidemia   . Hypertension   . Myocardial infarction [facility]) 2011   caused VF arresr; s/p LAD bare metal stent; Sees Dr Romeo Apple @ [facility]  annualyy s/p stent    Past Surgical History:  Procedure Laterality Date  . ANTERIOR CERVICAL DECOMP/DISCECTOMY FUSION N/A 01/08/2014   Procedure: ANTERIOR CERVICAL DECOMPRESSION/DISCECTOMY FUSION 2 LEVELS;  Surgeon: Tressie Stalker, MD;  Location: [facility];  Service: Neurosurgery;  Laterality: N/A;  Cervical Five-Six/Six-Seven Anterior Cervical Decompression with Fusoin interbody prosthesis plating and bonegraft  . CORONARY ANGIOPLASTY  [id number]   Dr Bernette Redbird at [facility]  . TONSILLECTOMY    . WISDOM TOOTH EXTRACTION      Current Facility-Administered Medications  Medication Dose Route Frequency Provider Last Rate Last Dose  . ceFAZolin (ANCEF) 3 g in dextrose 5 % 50 mL IVPB  3 g Intravenous To SS-Surg Kathryne Hitch, MD      . lactated ringers infusion   Intravenous Continuous Lucretia Kern, MD 10 mL/hr at 05/10/18 [phone]    . tranexamic acid (CYKLOKAPRON) IVPB 1,000 mg  1,000 mg Intravenous To OR Kathryne Hitch, MD       No Known Allergies  Social History   Tobacco Use  . Smoking status: Former Smoker    Types: Cigars    Last attempt to quit: 01/01/2013    Years since quitting: 5.3  . Smokeless tobacco: Never Used  Substance Use Topics  . Alcohol use: Yes    Alcohol/week: 1.0 standard drinks    Types: 1 Shots of liquor per week    Comment: per night    Family History  Problem Relation Age of Onset  . Heart disease Mother   . Heart disease Maternal Grandfather   . Heart disease Paternal Grandmother   .  Heart disease Paternal Grandfather      Review of Systems  Musculoskeletal: Positive for joint pain.  All other systems reviewed and are negative.   Objective:  Physical Exam  Constitutional: He is oriented to person, place, and time. He appears well-developed and well-nourished.  HENT:  Head: Normocephalic and atraumatic.  Eyes: Pupils are equal, round, and reactive to light.  Neck: Normal range of motion.  Cardiovascular: Normal rate.   Respiratory: Effort normal.  GI: Soft. Bowel sounds are normal.  Musculoskeletal:       Right hip: He exhibits decreased range of motion, decreased strength, tenderness and bony tenderness.  Neurological: He is alert and oriented to person, place, and time.  Skin: Skin is warm and dry.  Psychiatric: He has a normal mood and affect.    Vital signs in last 24 hours: Temp:  [98.6 F (37 C)] 98.6 F (37 C) (12/10 0803) Pulse Rate:  [80] 80 (12/10 0803) Resp:  [18] 18 (12/10 0803) BP: (157)/(94) 157/94 (12/10 0803) SpO2:  [95 %] 95 % (12/10 0803) Weight:  [120.2 kg] 120.2 kg (12/10 0803)  Labs:   Estimated body mass index is 35.94 kg/m as calculated from the following:   Height as of this encounter: 6' (1.829 m).   Weight as of this encounter: 120.2 kg.   Imaging Review Plain radiographs demonstrate severe degenerative joint disease of the right hip(s). The bone quality appears to be excellent for age and reported activity level.    Preoperative templating of the joint replacement has been completed, documented, and submitted to the Operating Room personnel in order to optimize intra-operative equipment management.     Assessment/Plan:  End stage arthritis, right hip(s)  The patient history, physical examination, clinical judgement of the provider and imaging studies are consistent with end stage degenerative joint disease of the right hip(s) and total hip arthroplasty is deemed medically necessary. The treatment options including medical management, injection therapy, arthroscopy and arthroplasty were discussed at length. The risks and benefits of total hip arthroplasty were presented and reviewed. The risks due to aseptic loosening, infection, stiffness, dislocation/subluxation,  thromboembolic complications and other imponderables were discussed.  The patient acknowledged the explanation, agreed to proceed with the plan and consent was signed. Patient is being admitted for  inpatient treatment for surgery, pain control, PT, OT, prophylactic antibiotics, VTE prophylaxis, progressive ambulation and ADL's and discharge planning.The patient is planning to be discharged home with home health services

## 2018-05-11 ENCOUNTER — Encounter (HOSPITAL_COMMUNITY): Payer: Self-pay | Admitting: Orthopaedic Surgery

## 2018-05-11 LAB — BASIC METABOLIC PANEL
Anion gap: 10 (ref 5–15)
BUN: 18 mg/dL (ref 6–20)
CO2: 22 mmol/L (ref 22–32)
Calcium: 8.3 mg/dL — ABNORMAL LOW (ref 8.9–10.3)
Chloride: 100 mmol/L (ref 98–111)
Creatinine, Ser: 1.09 mg/dL (ref 0.61–1.24)
GFR calc Af Amer: >60 mL/min (ref >60)
GFR calc non Af Amer: >60 mL/min (ref >60)
Glucose, Bld: 234 mg/dL — ABNORMAL HIGH (ref 70–99)
Potassium: 3.9 mmol/L (ref 3.5–5.1)
Sodium: 132 mmol/L — ABNORMAL LOW (ref 135–145)

## 2018-05-11 LAB — CBC
HCT: 30.3 % — ABNORMAL LOW (ref 39.0–52.0)
Hemoglobin: 10.6 g/dL — ABNORMAL LOW (ref 13.0–17.0)
MCH: 29.9 pg (ref 26.0–34.0)
MCHC: 35 g/dL (ref 30.0–36.0)
MCV: 85.6 fL (ref 80.0–100.0)
Platelets: 160 10*3/uL (ref 150–400)
RBC: 3.54 MIL/uL — ABNORMAL LOW (ref 4.22–5.81)
RDW: 12.4 % (ref 11.5–15.5)
WBC: 8.2 10*3/uL (ref 4.0–10.5)
nRBC: 0 % (ref 0.0–0.2)

## 2018-05-11 MED ORDER — METHOCARBAMOL 500 MG PO TABS: 500.0000 mg | ORAL_TABLET | Freq: Four times a day (QID) | ORAL | 0 refills | Status: DC | PRN

## 2018-05-11 MED ORDER — OXYCODONE HCL 5 MG PO TABS: 5.0000 mg | ORAL_TABLET | ORAL | 0 refills | Status: DC | PRN

## 2018-05-11 NOTE — Progress Notes (Signed)
Patient ID: Eddie CirriWalter B Jolly, male   DOB: May 24, 1961, 57 y.o.   MRN: [id number] He has progressed very well with therapy.  His pain is under good control.  His vitals are stable.  He can actually be discharged to home this afternoon and he is fine with that.

## 2018-05-11 NOTE — Discharge Summary (Signed)
Patient ID: Eddie Alexander MRN: [id number] DOB/AGE: 1960/09/19 57 y.o.  Admit date: 05/10/2018 Discharge date: 05/11/2018  Admission Diagnoses:  Principal Problem:   Unilateral primary osteoarthritis, right hip Active Problems:   Status post total replacement of right hip   Discharge Diagnoses:  Same  Past Medical History:  Diagnosis Date  . Arthritis   . Back pain   . CAD (coronary artery disease)   . Cervical spondylosis with myelopathy   . GERD (gastroesophageal reflux disease)   . H/O urinary frequency   . Hyperlipidemia   . Hypertension   . Myocardial infarction [facility](HCC) 2011   caused VF arresr; s/p LAD bare metal stent; Sees Dr Romeo AppleHarrison @ [facility] annualyy s/p stent    Surgeries: Procedure(s): RIGHT TOTAL HIP ARTHROPLASTY ANTERIOR APPROACH on 05/10/2018   Consultants:   Discharged Condition: Improved  Hospital Course: Eddie Alexander is an 57 y.o. male who was admitted 05/10/2018 for operative treatment ofUnilateral primary osteoarthritis, right hip. Patient has severe unremitting pain that affects sleep, daily activities, and work/hobbies. After pre-op clearance the patient was taken to the operating room on 05/10/2018 and underwent  Procedure(s): RIGHT TOTAL HIP ARTHROPLASTY ANTERIOR APPROACH.    Patient was given perioperative antibiotics:  Anti-infectives (From admission, onward)   Start     Dose/Rate Route Frequency Ordered Stop   05/10/18 1515  ceFAZolin (ANCEF) IVPB 1 g/50 mL premix     1 g 100 mL/hr over 30 Minutes Intravenous Every 6 hours 05/10/18 1513 05/10/18 2050   05/10/18 1509  ceFAZolin (ANCEF) 1-4 GM/50ML-% IVPB    Note to Pharmacy:  Gean QuintShaver, Maryann   : cabinet override      05/10/18 1509 05/11/18 0329   05/10/18 0900  ceFAZolin (ANCEF) 3 g in dextrose 5 % 50 mL IVPB     3 g 100 mL/hr over 30 Minutes Intravenous To [facility] 05/09/18 1409 05/10/18 0930       Patient was given sequential compression devices, early ambulation, and  chemoprophylaxis to prevent DVT.  Patient benefited maximally from hospital stay and there were no complications.    Recent vital signs:  Patient Vitals for the past 24 hrs:  BP Temp Temp src Pulse Resp SpO2  05/11/18 1512 103/65 98.2 F (36.8 C) Oral 72 16 100 %  05/11/18 0836 136/80 97.7 F (36.5 C) Oral 85 16 100 %  05/11/18 0326 140/87 97.8 F (36.6 C) Oral 71 18 99 %  05/11/18 0024 122/85 97.9 F (36.6 C) Oral 79 18 98 %  05/10/18 2147 (!) 134/91 98.7 F (37.1 C) Oral 90 16 98 %     Recent laboratory studies:  Recent Labs    05/11/18 0249  WBC 8.2  HGB 10.6*  HCT 30.3*  PLT 160  NA 132*  K 3.9  CL 100  CO2 22  BUN 18  CREATININE 1.09  GLUCOSE 234*  CALCIUM 8.3*     Discharge Medications:   Allergies as of 05/11/2018   No Known Allergies     Medication List    TAKE these medications   acetaminophen-codeine 300-30 MG tablet Commonly known as:  TYLENOL #3 Take 1-2 tablets by mouth every 8 (eight) hours as needed for moderate pain.   amLODipine 5 MG tablet Commonly known as:  NORVASC Take 5 mg by mouth daily.   aspirin EC 81 MG tablet Take 81 mg by mouth daily.   atorvastatin 20 MG tablet Commonly known as:  LIPITOR Take 20 mg by mouth daily.  carvedilol 12.5 MG tablet Commonly known as:  COREG Take 12.5 mg by mouth 2 (two) times daily with a meal.   clopidogrel 75 MG tablet Commonly known as:  PLAVIX Take 75 mg by mouth daily.   Fish Oil 1200 MG Caps Take 1,200 mg by mouth daily.   ibuprofen 200 MG tablet Commonly known as:  ADVIL,MOTRIN Take 1,000 mg by mouth every 6 (six) hours as needed for moderate pain.   lisinopril 40 MG tablet Commonly known as:  PRINIVIL,ZESTRIL Take 40 mg by mouth daily.   methocarbamol 500 MG tablet Commonly known as:  ROBAXIN Take 1 tablet (500 mg total) by mouth every 6 (six) hours as needed for muscle spasms.   oxyCODONE 5 MG immediate release tablet Commonly known as:  Oxy IR/ROXICODONE Take 1-2  tablets (5-10 mg total) by mouth every 3 (three) hours as needed for moderate pain (pain score 4-6).            Durable Medical Equipment  (From admission, onward)         Start     Ordered   05/11/18 1519  For home use only DME 3 n 1  Once     05/11/18 1518          Diagnostic Studies: Dg Pelvis Portable  Result Date: 05/10/2018 CLINICAL DATA:  Post total hip replacement EXAM: PORTABLE PELVIS 1-2 VIEWS COMPARISON:  04/20/2018 FINDINGS: Changes of right hip replacement. Normal AP alignment. No hardware or bony complicating feature. IMPRESSION: Right hip replacement.  No visible complicating feature. Electronically Signed   By: Charlett Nose M.D.   On: 05/10/2018 11:37   Dg C-arm 1-60 Min  Result Date: 05/10/2018 CLINICAL DATA:  57 year old male undergoing right hip arthroplasty. EXAM: OPERATIVE RIGHT HIP (WITH PELVIS IF PERFORMED) 2 VIEWS TECHNIQUE: Fluoroscopic spot image(s) were submitted for interpretation post-operatively. COMPARISON:  04/20/2018. FINDINGS: 2 intraoperative fluoroscopic AP spot views of the lower pelvis and right hip. Right bipolar hip arthroplasty hardware appears intact and normally aligned in the AP view. No unexpected osseous changes are identified. FLUOROSCOPY TIME:  0 minutes 35 seconds IMPRESSION: Right hip arthroplasty with no adverse features. Electronically Signed   By: Odessa Fleming M.D.   On: 05/10/2018 10:53   Dg Hip Operative Unilat W Or W/o Pelvis Right  Result Date: 05/10/2018 CLINICAL DATA:  57 year old male undergoing right hip arthroplasty. EXAM: OPERATIVE RIGHT HIP (WITH PELVIS IF PERFORMED) 2 VIEWS TECHNIQUE: Fluoroscopic spot image(s) were submitted for interpretation post-operatively. COMPARISON:  04/20/2018. FINDINGS: 2 intraoperative fluoroscopic AP spot views of the lower pelvis and right hip. Right bipolar hip arthroplasty hardware appears intact and normally aligned in the AP view. No unexpected osseous changes are identified. FLUOROSCOPY  TIME:  0 minutes 35 seconds IMPRESSION: Right hip arthroplasty with no adverse features. Electronically Signed   By: Odessa Fleming M.D.   On: 05/10/2018 10:53   Xr Hip Unilat W Or W/o Pelvis 1v Right  Result Date: 04/20/2018 An AP pelvis and lateral of the right hip show severe end-stage arthritis of the right hip.  There is complete loss of joint space.  There is flattening of the femoral head.  There are sclerotic changes in the femoral head and the acetabulum.  There are large para-articular osteophytes.   Disposition: Discharge disposition: 01-Home or Self Care         Follow-up Information    Kathryne Hitch, MD Follow up in 2 week(s).   Specialty:  Orthopedic Surgery Contact information: [address] [place] [address] [phone]        Home[facility] Follow up.   Specialty:  Home Health Services Why:  A representative from [facility] will contact you to arange start date and time for your therapy. Contact information: [address] [place] [address] [place] [address] [phone]            Signed: Kathryne Hitch 05/11/2018, 5:29 PM

## 2018-05-11 NOTE — Evaluation (Signed)
Occupational Therapy Evaluation Patient Details Name: Eddie CirriWalter B Jolly MRN: [id number] DOB: 1960-11-30 Today's Date: 05/11/2018    History of Present Illness Pt is a 57 y.o. male s/p R THA (direct anterior approach) on 05/10/18. PMH includes HTN, CAD, cervical spondylosis (s/p ACDF C5-7), back pain.   Clinical Impression   PTA Pt independent in ADL and mobility. Pt is currently mod A for LB dressing tasks (wife willing and able to assist) and min guard progressing to supervision for transfers. Pt able to complete transfer, followed by sink level grooming at supervision - good balance without UE support at sink, and hallway ambulation at supervision level. AE education completed for LB ADL and OT education complete. OT to sign off at this time. Thank you for the opportunity to serve this patient.     Follow Up Recommendations  No OT follow up;Supervision - Intermittent    Equipment Recommendations  3 in 1 bedside commode    Recommendations for Other Services       Precautions / Restrictions Precautions Precautions: Fall Restrictions Weight Bearing Restrictions: Yes RLE Weight Bearing: Weight bearing as tolerated      Mobility Bed Mobility               General bed mobility comments: Pt OOB in recliner at beginning and end of session  Transfers Overall transfer level: Needs assistance Equipment used: Rolling walker (2 wheeled) Transfers: Sit to/from Stand Sit to Stand: Supervision              Balance Overall balance assessment: Needs assistance Sitting-balance support: No upper extremity supported;Feet supported Sitting balance-Leahy Scale: Good     Standing balance support: No upper extremity supported;During functional activity Standing balance-Leahy Scale: Fair Standing balance comment: static stands without, dependent for dynamic activity                           ADL either performed or assessed with clinical judgement   ADL Overall ADL's  : Needs assistance/impaired Eating/Feeding: Independent   Grooming: Wash/dry hands;Wash/dry face;Oral care;Supervision/safety;Standing Grooming Details (indicate cue type and reason): sink level Upper Body Bathing: Supervision/ safety;Standing   Lower Body Bathing: Moderate assistance;Sit to/from stand Lower Body Bathing Details (indicate cue type and reason): educated on long handle sponge Upper Body Dressing : Supervision/safety;Sitting Upper Body Dressing Details (indicate cue type and reason): donned shirt during session Lower Body Dressing: Moderate assistance;With caregiver independent assisting;Sit to/from stand Lower Body Dressing Details (indicate cue type and reason): educated in AE for LB (grabber/reacher, long handle shoe horn, sock donner) Toilet Transfer: Supervision/safety;RW   Toileting- ArchitectClothing Manipulation and Hygiene: Min guard;Sit to/from stand   Tub/ Shower Transfer: Min guard;Ambulation;3 in 1;Rolling walker   Functional mobility during ADLs: Supervision/safety;Rolling walker       Vision Baseline Vision/History: Wears glasses Wears Glasses: At all times Patient Visual Report: No change from baseline       Perception     Praxis      Pertinent Vitals/Pain Pain Assessment: Faces Faces Pain Scale: Hurts a little bit Pain Location: R hip Pain Descriptors / Indicators: Sore Pain Intervention(s): Monitored during session;Repositioned     Hand Dominance Right   Extremity/Trunk Assessment Upper Extremity Assessment Upper Extremity Assessment: Overall WFL for tasks assessed   Lower Extremity Assessment Lower Extremity Assessment: Defer to PT evaluation   Cervical / Trunk Assessment Cervical / Trunk Assessment: Normal   Communication Communication Communication: No difficulties   Cognition Arousal/Alertness: Awake/alert Behavior  During Therapy: WFL for tasks assessed/performed Overall Cognitive Status: Within Functional Limits for tasks  assessed                                     General Comments       Exercises     Shoulder Instructions      Home Living Family/patient expects to be discharged to:: Private residence Living Arrangements: Spouse/significant other Available Help at Discharge: Family;Available 24 hours/day Type of Home: House Home Access: Stairs to enter [employer] of Steps: 3 Entrance Stairs-Rails: Right Home Layout: Two level;Bed/bath upstairs;1/2 bath on main level Alternate Level Stairs-Number of Steps: 14 Alternate Level Stairs-Rails: Right Bathroom Shower/Tub: Producer, television/film/video: Standard Bathroom Accessibility: Yes How Accessible: Accessible via walker Home Equipment: Walker - 2 wheels          Prior Functioning/Environment Level of Independence: Independent        Comments: works full time, enjoys the outdoors and hunting        OT Problem List: Decreased range of motion;Decreased activity tolerance;Impaired balance (sitting and/or standing);Decreased knowledge of use of DME or AE;Pain      OT Treatment/Interventions:      OT Goals(Current goals can be found in the care plan section) Acute Rehab OT Goals Patient Stated Goal: get home TODAY! OT Goal Formulation: With patient Time For Goal Achievement: 05/25/18 Potential to Achieve Goals: Good  OT Frequency:     Barriers to D/C:            Co-evaluation              AM-PAC OT "6 Clicks" Daily Activity     Outcome Measure Help from another person eating meals?: None Help from another person taking care of personal grooming?: None Help from another person toileting, which includes using toliet, bedpan, or urinal?: None Help from another person bathing (including washing, rinsing, drying)?: A Little Help from another person to put on and taking off regular upper body clothing?: None Help from another person to put on and taking off regular lower body clothing?: A Lot 6  Click Score: 21   End of Session Equipment Utilized During Treatment: Gait belt;Rolling walker Nurse Communication: Mobility status  Activity Tolerance: Patient tolerated treatment well Patient left: in chair;with call bell/phone within reach  OT Visit Diagnosis: Other abnormalities of gait and mobility (R26.89);Pain Pain - Right/Left: Right Pain - part of body: Hip                Time: 4098-1191 OT Time Calculation (min): 22 min Charges:  OT General Charges $OT Visit: 1 Visit OT Evaluation $OT Eval Moderate Complexity: 1 Mod  Sherryl Manges OTR/L Acute Rehabilitation Services Pager: [phone] Office: [phone]  Evern Bio Wilbur Labuda 05/11/2018, 10:01 AM

## 2018-05-11 NOTE — Progress Notes (Signed)
Pt is ambulating with walker to the hall. Right hip dressing dry and intact. Pain is controlled with oral narcotics. Discharge instructions was given to pt and wife. Discharged to home accompanied by wife.

## 2018-05-11 NOTE — Progress Notes (Signed)
Subjective: 1 Day Post-Op Procedure(s) (LRB): RIGHT TOTAL HIP ARTHROPLASTY ANTERIOR APPROACH (Right) Patient reports pain as moderate.    Objective: Vital signs in last 24 hours: Temp:  [97 F (36.1 C)-98.7 F (37.1 C)] 97.8 F (36.6 C) (12/11 0326) Pulse Rate:  [67-93] 71 (12/11 0326) Resp:  [14-22] 18 (12/11 0326) BP: (83-157)/(55-94) 140/87 (12/11 0326) SpO2:  [94 %-100 %] 99 % (12/11 0326) Weight:  [120.2 kg] 120.2 kg (12/10 0803)  Intake/Output from previous day: 12/10 0701 - 12/11 0700 In: 2644.5 [P.O.:300; I.V.:2294.5; IV Piggyback:50] Out: 1770 [Urine:1470; Blood:300] Intake/Output this shift: No intake/output data recorded.  Recent Labs    05/11/18 0249  HGB 10.6*   Recent Labs    05/11/18 0249  WBC 8.2  RBC 3.54*  HCT 30.3*  PLT 160   Recent Labs    05/11/18 0249  NA 132*  K 3.9  CL 100  CO2 22  BUN 18  CREATININE 1.09  GLUCOSE 234*  CALCIUM 8.3*   No results for input(s): LABPT, INR in the last 72 hours.  Sensation intact distally Intact pulses distally Dorsiflexion/Plantar flexion intact Incision: dressing C/D/I  Assessment/Plan: 1 Day Post-Op Procedure(s) (LRB): RIGHT TOTAL HIP ARTHROPLASTY ANTERIOR APPROACH (Right) Up with therapy Discharge home with home health next 1-2 days.    Kathryne HitchChristopher Y Blackman 05/11/2018, 7:50 AM

## 2018-05-11 NOTE — Plan of Care (Signed)
  Problem: Education: Goal: Knowledge of General Education information will improve Description Including pain rating scale, medication(s)/side effects and non-pharmacologic comfort measures Outcome: Progressing   Problem: Health Behavior/Discharge Planning: Goal: Ability to manage health-related needs will improve Outcome: Progressing   Problem: Clinical Measurements: Goal: Ability to maintain clinical measurements within normal limits will improve Outcome: Progressing Goal: Will remain free from infection Outcome: Progressing Goal: Diagnostic test results will improve Outcome: Progressing Goal: Respiratory complications will improve Outcome: Progressing Goal: Cardiovascular complication will be avoided Outcome: Progressing   Problem: Activity: Goal: Risk for activity intolerance will decrease Outcome: Progressing   Problem: Nutrition: Goal: Adequate nutrition will be maintained Outcome: Progressing   Problem: Coping: Goal: Level of anxiety will decrease Outcome: Progressing   Problem: Elimination: Goal: Will not experience complications related to bowel motility Outcome: Progressing   Problem: Pain Managment: Goal: General experience of comfort will improve Outcome: Progressing   Problem: Safety: Goal: Ability to remain free from injury will improve Outcome: Progressing Note:  Pt remains free of falls. Bed locked and in lowest position. Call bell within reach. Will continue to monitor.

## 2018-05-11 NOTE — Progress Notes (Signed)
Physical Therapy Treatment Patient Details Name: Eddie CirriWalter B Alexander MRN: [id number] DOB: Aug 04, 1960 Today's Date: 05/11/2018    History of Present Illness Pt is a 57 y.o. male s/p R THA (direct anterior approach) on 05/10/18. PMH includes HTN, CAD, cervical spondylosis (s/p ACDF C5-7), back pain.    PT Comments    Pt was seen for stairs and exercises to R hip with pt demonstrating understanding of the information.  Pt is expecting to go home soon, but not clear when this is happening.  Pt will be seen further acutely for re-instruction of safety and strengthening as tolerated for RLE, as well as to stretch the joint within comfort to maintain mobility post op.     Follow Up Recommendations  Follow surgeon's recommendation for DC plan and follow-up therapies;Supervision for mobility/OOB     Equipment Recommendations  3in1 (PT)    Recommendations for Other Services       Precautions / Restrictions Precautions Precautions: Fall Restrictions Weight Bearing Restrictions: Yes RLE Weight Bearing: Weight bearing as tolerated    Mobility  Bed Mobility               General bed mobility comments: up in chair when PT arrived  Transfers Overall transfer level: Needs assistance Equipment used: Rolling walker (2 wheeled) Transfers: Sit to/from Stand Sit to Stand: Supervision         General transfer comment: good use of hand placement  Ambulation/Gait Ambulation/Gait assistance: Min guard Gait Distance (Feet): 200 Feet Assistive device: Rolling walker (2 wheeled) Gait Pattern/deviations: Step-through pattern;Decreased stride length;Decreased weight shift to right Gait velocity: Decreased Gait velocity interpretation: <1.31 ft/sec, indicative of household ambulator General Gait Details: minimizes WB on RLE with RW but controlled balance   Stairs Stairs: Yes Stairs assistance: Min guard Stair Management: One rail Right;Forwards;Step to pattern Number of Stairs:  13 General stair comments: good recall of pattern   Wheelchair Mobility    Modified Rankin (Stroke Patients Only)       Balance Overall balance assessment: Needs assistance Sitting-balance support: Feet supported Sitting balance-Leahy Scale: Good     Standing balance support: Bilateral upper extremity supported;During functional activity Standing balance-Leahy Scale: Fair Standing balance comment: walker more for dynamic use, O2 sats were 95% after gait                            Cognition Arousal/Alertness: Awake/alert Behavior During Therapy: WFL for tasks assessed/performed Overall Cognitive Status: Within Functional Limits for tasks assessed                                        Exercises General Exercises - Lower Extremity Ankle Circles/Pumps: AROM;Both;5 reps Gluteal Sets: Standing;5 reps;Right(stretch) Hip ABduction/ADduction: AROM;AAROM;Both;10 reps    General Comments General comments (skin integrity, edema, etc.): daughter arrived at end of session      Pertinent Vitals/Pain Pain Assessment: 0-10 Faces Pain Scale: Hurts a little bit Pain Location: R hip Pain Descriptors / Indicators: Sore Pain Intervention(s): Monitored during session    Home Living Family/patient expects to be discharged to:: Private residence Living Arrangements: Spouse/significant other Available Help at Discharge: Family;Available 24 hours/day Type of Home: House Home Access: Stairs to enter Entrance Stairs-Rails: Right Home Layout: Two level;Bed/bath upstairs;1/2 bath on main level Home Equipment: Walker - 2 wheels      Prior Function Level of Independence:  Independent      Comments: works full time, enjoys the outdoors and hunting   PT Goals (current goals can now be found in the care plan section) Acute Rehab PT Goals Patient Stated Goal: get home TODAY! Progress towards PT goals: Progressing toward goals    Frequency    7X/week       PT Plan Current plan remains appropriate    Co-evaluation              AM-PAC PT "6 Clicks" Mobility   Outcome Measure  Help needed turning from your back to your side while in a flat bed without using bedrails?: None Help needed moving from lying on your back to sitting on the side of a flat bed without using bedrails?: A Little Help needed moving to and from a bed to a chair (including a wheelchair)?: A Little Help needed standing up from a chair using your arms (e.g., wheelchair or bedside chair)?: A Little Help needed to walk in hospital room?: A Little Help needed climbing 3-5 steps with a railing? : A Little 6 Click Score: 19    End of Session Equipment Utilized During Treatment: Gait belt Activity Tolerance: Patient tolerated treatment well Patient left: in chair;with call bell/phone within reach;with family/visitor present Nurse Communication: Mobility status PT Visit Diagnosis: Other abnormalities of gait and mobility (R26.89);Pain Pain - Right/Left: Right Pain - part of body: Hip     Time: 2130-8657 PT Time Calculation (min) (ACUTE ONLY): 34 min  Charges:  $Gait Training: 8-22 mins $Therapeutic Exercise: 8-22 mins                   Ivar Drape 05/11/2018, 12:35 PM   Samul Dada, PT MS Acute Rehab Dept. Number: [facility] [id number] and [facility] [phone]

## 2018-05-11 NOTE — Discharge Instructions (Signed)
INSTRUCTIONS AFTER JOINT REPLACEMENT  ° °o Remove items at home which could result in a fall. This includes throw rugs or furniture in walking pathways °o ICE to the affected joint every three hours while awake for 30 minutes at a time, for at least the first 3-5 days, and then as needed for pain and swelling.  Continue to use ice for pain and swelling. You may notice swelling that will progress down to the foot and ankle.  This is normal after surgery.  Elevate your leg when you are not up walking on it.   °o Continue to use the breathing machine you got in the hospital (incentive spirometer) which will help keep your temperature down.  It is common for your temperature to cycle up and down following surgery, especially at night when you are not up moving around and exerting yourself.  The breathing machine keeps your lungs expanded and your temperature down. ° ° °DIET:  As you were doing prior to hospitalization, we recommend a well-balanced diet. ° °DRESSING / WOUND CARE / SHOWERING ° °Keep the surgical dressing until follow up.  The dressing is water proof, so you can shower without any extra covering.  IF THE DRESSING FALLS OFF or the wound gets wet inside, change the dressing with sterile gauze.  Please use good hand washing techniques before changing the dressing.  Do not use any lotions or creams on the incision until instructed by your surgeon.   ° °ACTIVITY ° °o Increase activity slowly as tolerated, but follow the weight bearing instructions below.   °o No driving for 6 weeks or until further direction given by your physician.  You cannot drive while taking narcotics.  °o No lifting or carrying greater than 10 lbs. until further directed by your surgeon. °o Avoid periods of inactivity such as sitting longer than an hour when not asleep. This helps prevent blood clots.  °o You may return to work once you are authorized by your doctor.  ° ° ° °WEIGHT BEARING  ° °Weight bearing as tolerated with assist  device (walker, cane, etc) as directed, use it as long as suggested by your surgeon or therapist, typically at least 4-6 weeks. ° ° °EXERCISES ° °Results after joint replacement surgery are often greatly improved when you follow the exercise, range of motion and muscle strengthening exercises prescribed by your doctor. Safety measures are also important to protect the joint from further injury. Any time any of these exercises cause you to have increased pain or swelling, decrease what you are doing until you are comfortable again and then slowly increase them. If you have problems or questions, call your caregiver or physical therapist for advice.  ° °Rehabilitation is important following a joint replacement. After just a few days of immobilization, the muscles of the leg can become weakened and shrink (atrophy).  These exercises are designed to build up the tone and strength of the thigh and leg muscles and to improve motion. Often times heat used for twenty to thirty minutes before working out will loosen up your tissues and help with improving the range of motion but do not use heat for the first two weeks following surgery (sometimes heat can increase post-operative swelling).  ° °These exercises can be done on a training (exercise) mat, on the floor, on a table or on a bed. Use whatever works the best and is most comfortable for you.    Use music or television while you are exercising so that   the exercises are a pleasant break in your day. This will make your life better with the exercises acting as a break in your routine that you can look forward to.   Perform all exercises about fifteen times, three times per day or as directed.  You should exercise both the operative leg and the other leg as well. ° °Exercises include: °  °• Quad Sets - Tighten up the muscle on the front of the thigh (Quad) and hold for 5-10 seconds.   °• Straight Leg Raises - With your knee straight (if you were given a brace, keep it on),  lift the leg to 60 degrees, hold for 3 seconds, and slowly lower the leg.  Perform this exercise against resistance later as your leg gets stronger.  °• Leg Slides: Lying on your back, slowly slide your foot toward your buttocks, bending your knee up off the floor (only go as far as is comfortable). Then slowly slide your foot back down until your leg is flat on the floor again.  °• Angel Wings: Lying on your back spread your legs to the side as far apart as you can without causing discomfort.  °• Hamstring Strength:  Lying on your back, push your heel against the floor with your leg straight by tightening up the muscles of your buttocks.  Repeat, but this time bend your knee to a comfortable angle, and push your heel against the floor.  You may put a pillow under the heel to make it more comfortable if necessary.  ° °A rehabilitation program following joint replacement surgery can speed recovery and prevent re-injury in the future due to weakened muscles. Contact your doctor or a physical therapist for more information on knee rehabilitation.  ° ° °CONSTIPATION ° °Constipation is defined medically as fewer than three stools per week and severe constipation as less than one stool per week.  Even if you have a regular bowel pattern at home, your normal regimen is likely to be disrupted due to multiple reasons following surgery.  Combination of anesthesia, postoperative narcotics, change in appetite and fluid intake all can affect your bowels.  ° °YOU MUST use at least one of the following options; they are listed in order of increasing strength to get the job done.  They are all available over the counter, and you may need to use some, POSSIBLY even all of these options:   ° °Drink plenty of fluids (prune juice may be helpful) and high fiber foods °Colace 100 mg by mouth twice a day  °Senokot for constipation as directed and as needed Dulcolax (bisacodyl), take with full glass of water  °Miralax (polyethylene glycol)  once or twice a day as needed. ° °If you have tried all these things and are unable to have a bowel movement in the first 3-4 days after surgery call either your surgeon or your primary doctor.   ° °If you experience loose stools or diarrhea, hold the medications until you stool forms back up.  If your symptoms do not get better within 1 week or if they get worse, check with your doctor.  If you experience "the worst abdominal pain ever" or develop nausea or vomiting, please contact the office immediately for further recommendations for treatment. ° ° °ITCHING:  If you experience itching with your medications, try taking only a single pain pill, or even half a pain pill at a time.  You can also use Benadryl over the counter for itching or also to   help with sleep.   TED HOSE STOCKINGS:  Use stockings on both legs until for at least 2 weeks or as directed by physician office. They may be removed at night for sleeping.  MEDICATIONS:  See your medication summary on the After Visit Summary that nursing will review with you.  You may have some home medications which will be placed on hold until you complete the course of blood thinner medication.  It is important for you to complete the blood thinner medication as prescribed.  PRECAUTIONS:  If you experience chest pain or shortness of breath - call 911 immediately for transfer to the hospital emergency department.   If you develop a fever greater that 101 F, purulent drainage from wound, increased redness or drainage from wound, foul odor from the wound/dressing, or calf pain - CONTACT YOUR SURGEON.                                                   FOLLOW-UP APPOINTMENTS:  If you do not already have a post-op appointment, please call the office for an appointment to be seen by your surgeon.  Guidelines for how soon to be seen are listed in your After Visit Summary, but are typically between 1-4 weeks after surgery.  OTHER INSTRUCTIONS:   Knee  Replacement:  Do not place pillow under knee, focus on keeping the knee straight while resting. CPM instructions: 0-90 degrees, 2 hours in the morning, 2 hours in the afternoon, and 2 hours in the evening. Place foam block, curve side up under heel at all times except when in CPM or when walking.  DO NOT modify, tear, cut, or change the foam block in any way.  MAKE SURE YOU:   Understand these instructions.   Get help right away if you are not doing well or get worse.    Thank you for letting us be a part of your medical care team.  It is a privilege we respect greatly.  We hope these instructions will help you stay on track for a fast and full recovery!    YOU CAN ACTUALLY CHANGE YOUR CURRENT DRESSING IN ONE WEEK FROM SURGERY.

## 2018-05-11 NOTE — Care Management Note (Signed)
Case Management Note  Patient Details  Name: Eddie CirriWalter B Jolly MRN: [id number] Date of Birth: December 26, 1960  Subjective/Objective:  57 yr old gentleman s/p right total hip arthroplasty.                  Action/Plan: Patient was preoperatively setup with [facility], no changes. Will have support at discharge.   Expected Discharge Date:  05/11/18               Expected Discharge Plan:  Home w Home Health Services  In-House Referral:  NA  Discharge planning Services  CM Consult  Post Acute Care Choice:  Durable Medical Equipment, Home Health Choice offered to:  Patient  DME Arranged:  3-N-1(has RW) DME Agency:  [facility]  HH Arranged:  PT HH Agency:  [facility]  Status of Service:  Completed, signed off  If discussed at [employer] Length of Stay Meetings, dates discussed:    Additional Comments:  Durenda GuthrieBrady, Mylea Roarty Naomi, RN 05/11/2018, 3:25 PM

## 2018-05-11 NOTE — Progress Notes (Signed)
Physical Therapy Treatment Patient Details Name: Eddie Alexander MRN: [id number] DOB: 1961/01/08 Today's Date: 05/11/2018    History of Present Illness Pt is a 57 y.o. male s/p R THA (direct anterior approach) on 05/10/18. PMH includes HTN, CAD, cervical spondylosis (s/p ACDF C5-7), back pain.    PT Comments    Pt was seen for mobility prior to DC and noted his mobility was good, able to maneuver on RW with no LOB.  Talked with pt about ice and stretch to manage pain, with continued PT HH to follow up on all exercises, balance and increasing tolerance for gait.     Follow Up Recommendations  Follow surgeon's recommendation for DC plan and follow-up therapies;Supervision for mobility/OOB     Equipment Recommendations  3in1 (PT)    Recommendations for Other Services       Precautions / Restrictions Precautions Precautions: Fall Restrictions Weight Bearing Restrictions: Yes RLE Weight Bearing: Weight bearing as tolerated    Mobility  Bed Mobility               General bed mobility comments: up in chair when PT arrived  Transfers Overall transfer level: Needs assistance Equipment used: Rolling walker (2 wheeled) Transfers: Sit to/from Stand Sit to Stand: Supervision         General transfer comment: good use of hand placement  Ambulation/Gait Ambulation/Gait assistance: Min guard Gait Distance (Feet): 200 Feet Assistive device: Rolling walker (2 wheeled) Gait Pattern/deviations: Step-through pattern;Decreased stride length;Decreased weight shift to right Gait velocity: Decreased   General Gait Details: minimizes WB on RLE with RW but controlled balance   Stairs   Stairs assistance: Min guard Stair Management: One rail Right;Forwards;Step to pattern   General stair comments: good recall of pattern   Wheelchair Mobility    Modified Rankin (Stroke Patients Only)       Balance Overall balance assessment: Needs assistance Sitting-balance support:  Feet supported Sitting balance-Leahy Scale: Good     Standing balance support: Bilateral upper extremity supported;During functional activity Standing balance-Leahy Scale: Fair Standing balance comment: walker more for dynamic use, O2 sats were 95% after gait                            Cognition Arousal/Alertness: Awake/alert Behavior During Therapy: WFL for tasks assessed/performed Overall Cognitive Status: Within Functional Limits for tasks assessed                                        Exercises      General Comments        Pertinent Vitals/Pain Pain Assessment: 0-10 Faces Pain Scale: Hurts a little bit Pain Location: R hip Pain Descriptors / Indicators: Sore    Home Living                      Prior Function            PT Goals (current goals can now be found in the care plan section) Acute Rehab PT Goals Patient Stated Goal: get home TODAY! Progress towards PT goals: Progressing toward goals    Frequency    7X/week      PT Plan Current plan remains appropriate    Co-evaluation              AM-PAC PT "6 Clicks" Mobility   Outcome Measure  Help needed turning from your back to your side while in a flat bed without using bedrails?: None Help needed moving from lying on your back to sitting on the side of a flat bed without using bedrails?: A Little Help needed moving to and from a bed to a chair (including a wheelchair)?: A Little Help needed standing up from a chair using your arms (e.g., wheelchair or bedside chair)?: A Little Help needed to walk in hospital room?: A Little Help needed climbing 3-5 steps with a railing? : A Little 6 Click Score: 19    End of Session Equipment Utilized During Treatment: Gait belt Activity Tolerance: Patient tolerated treatment well Patient left: in chair;with call bell/phone within reach;with family/visitor present Nurse Communication: Mobility status PT Visit Diagnosis:  Other abnormalities of gait and mobility (R26.89);Pain Pain - Right/Left: Right Pain - part of body: Hip     Time: [id number] PT Time Calculation (min) (ACUTE ONLY): 26 min  Charges:  $Gait Training: 8-22 mins $Therapeutic Activity: 8-22 mins                  Ivar DrapeRuth E Marolyn Urschel 05/11/2018, 7:57 PM   Samul Dadauth Elesa Garman, PT MS Acute Rehab Dept. Number: [facility] [phone] and [facility] [phone]

## 2018-05-13 ENCOUNTER — Telehealth (INDEPENDENT_AMBULATORY_CARE_PROVIDER_SITE_OTHER): Payer: Self-pay | Admitting: Orthopaedic Surgery

## 2018-05-13 NOTE — Telephone Encounter (Signed)
FYI

## 2018-05-13 NOTE — Telephone Encounter (Signed)
[facility] have him scheduled for started of care on Sunday, December 15th.  CB#[phone].  Thank you.

## 2018-05-16 ENCOUNTER — Telehealth (INDEPENDENT_AMBULATORY_CARE_PROVIDER_SITE_OTHER): Payer: Self-pay | Admitting: Orthopaedic Surgery

## 2018-05-16 NOTE — Telephone Encounter (Signed)
Verbal order given to Eddie Alexander  

## 2018-05-16 NOTE — Telephone Encounter (Signed)
Eddie PaddockMaria Alexander (PT) with [facility] called needing verbal orders for HHPT 3 wk 1 and 2 wk 1   The number to contact [name] is [phone]

## 2018-05-26 ENCOUNTER — Ambulatory Visit (INDEPENDENT_AMBULATORY_CARE_PROVIDER_SITE_OTHER): Payer: BLUE CROSS/BLUE SHIELD | Admitting: Orthopaedic Surgery

## 2018-05-26 ENCOUNTER — Encounter (INDEPENDENT_AMBULATORY_CARE_PROVIDER_SITE_OTHER): Payer: Self-pay | Admitting: Orthopaedic Surgery

## 2018-05-26 DIAGNOSIS — Z96641 Presence of right artificial hip joint: Secondary | ICD-10-CM

## 2018-05-26 NOTE — Progress Notes (Signed)
The patient is 2 weeks tomorrow status post a right total hip arthroplasty.  He is doing well overall.  He is already driven.  He is taking minimal medications.  He is on his normal blood thinning medication that he takes chronically.  He has no significant issues at all.  On exam remove the staples and placed Steri-Strips from his right hip.  Incision looks good.  His leg lengths are equal.  He is stiff and wants to be a put on his shoes and socks easier and he knows that will come with time.  Notes from physical therapy also have released him.  He understands it will take a while to get him back to his mobility and that he should let his body be the guide in terms of his balance coordination all when he gets rid of his cane.  I will see him back in 4 weeks to see how is doing overall.  All question concerns were answered and addressed.

## 2018-06-23 ENCOUNTER — Encounter (INDEPENDENT_AMBULATORY_CARE_PROVIDER_SITE_OTHER): Payer: Self-pay | Admitting: Orthopaedic Surgery

## 2018-06-23 ENCOUNTER — Ambulatory Visit (INDEPENDENT_AMBULATORY_CARE_PROVIDER_SITE_OTHER): Payer: BLUE CROSS/BLUE SHIELD | Admitting: Orthopaedic Surgery

## 2018-06-23 DIAGNOSIS — Z96641 Presence of right artificial hip joint: Secondary | ICD-10-CM

## 2018-06-23 NOTE — Progress Notes (Signed)
[name] is now 6 weeks status post a right total hip arthroplasty.  He is very pleased overall about the progress he is making.  He still having stiffness in the hip and trouble getting his shoes and socks on and off, but overall he feels like he is making good progress.  He is driving.  He is walking without an assistive device.  He has some residual numbness around his incision but overall he has no significant issues.  On exam he is still stiff on his right hip with internal and external rotation but it certainly improved from his preoperative status.  At this point continue increase his activities as comfort allows.  I do not need to see him back for 6 months.  All question concerns were answered and addressed.  If he has any issues before then he will let us know.  Otherwise at his next visit I would like a standing low AP pelvis and lateral of his right operative hip.

## 2018-07-11 ENCOUNTER — Ambulatory Visit (INDEPENDENT_AMBULATORY_CARE_PROVIDER_SITE_OTHER): Payer: BLUE CROSS/BLUE SHIELD | Admitting: Specialist

## 2018-08-19 ENCOUNTER — Encounter (INDEPENDENT_AMBULATORY_CARE_PROVIDER_SITE_OTHER): Payer: Self-pay | Admitting: Specialist

## 2018-08-19 ENCOUNTER — Ambulatory Visit (INDEPENDENT_AMBULATORY_CARE_PROVIDER_SITE_OTHER): Payer: BLUE CROSS/BLUE SHIELD | Admitting: Specialist

## 2018-08-19 ENCOUNTER — Other Ambulatory Visit: Payer: Self-pay

## 2018-08-19 VITALS — BP 178/95 | HR 70 | Ht 72.0 in | Wt 265.0 lb

## 2018-08-19 DIAGNOSIS — M48062 Spinal stenosis, lumbar region with neurogenic claudication: Secondary | ICD-10-CM

## 2018-08-19 DIAGNOSIS — M47816 Spondylosis without myelopathy or radiculopathy, lumbar region: Secondary | ICD-10-CM

## 2018-08-19 DIAGNOSIS — M5136 Other intervertebral disc degeneration, lumbar region: Secondary | ICD-10-CM

## 2018-08-19 DIAGNOSIS — M1611 Unilateral primary osteoarthritis, right hip: Secondary | ICD-10-CM

## 2018-08-19 MED ORDER — DICLOFENAC SODIUM 50 MG PO TBEC: DELAYED_RELEASE_TABLET | ORAL | 3 refills | Status: DC

## 2018-08-19 MED ORDER — OMEPRAZOLE 20 MG PO CPDR: 20.0000 mg | DELAYED_RELEASE_CAPSULE | Freq: Every day | ORAL | 3 refills | Status: DC

## 2018-08-19 NOTE — Patient Instructions (Signed)
Avoid bending, stooping and avoid lifting weights greater than 10 lbs. Avoid prolong standing and walking. Avoid frequent bending and stooping  No lifting greater than 10 lbs. May use ice or moist heat for pain. Weight loss is of benefit. Handicap license is approved. Dr. Newton's secretary/Assistant will call to arrange for epidural steroid injection  

## 2018-08-19 NOTE — Progress Notes (Addendum)
Office Visit Note   Patient: Eddie Alexander           Date of Birth: 01/04/1961           MRN: [id number] Visit Date: 08/19/2018              Requested by: Dorcas Carrow, DO [address] [place], [place] [address] PCP: Steele Sizer, MD   Assessment & Plan: Visit Diagnoses:  1. Spondylosis without myelopathy or radiculopathy, lumbar region   2. Spinal stenosis of lumbar region with neurogenic claudication   3. Degenerative disc disease, lumbar   4. Unilateral primary osteoarthritis, right hip     Plan: Avoid bending, stooping and avoid lifting weights greater than 10 lbs. Avoid prolong standing and walking. Avoid frequent bending and stooping  No lifting greater than 10 lbs. May use ice or moist heat for pain. Weight loss is of benefit. Handicap license is approved. Dr. [place] secretary/Assistant will call to arrange for epidural steroid injection   Follow-Up Instructions: Return in about 4 weeks (around 09/16/2018).   Orders:  No orders of the defined types were placed in this encounter.  Meds ordered this encounter  Medications  . diclofenac (VOLTAREN) 50 MG EC tablet    Sig: Take 1 tablet (50 mg total) by mouth daily after breakfast for 7 days, THEN 1 tablet (50 mg total) 2 (two) times daily for 7 days, THEN 1 tablet (50 mg total) 3 (three) times daily for 28 days.    Dispense:  105 tablet    Refill:  3      Procedures: No procedures performed   Clinical Data: No additional findings.   Subjective: Chief Complaint  Patient presents with  . Lower Back - Follow-up, Pain    58 year old male with history of lumbar spinal stenosis and right hip osteoarthritis. He has been having severe pain with standing and walking. Found to have both conditions and underwent a right total hip replacement for the arthrosis and now is better but still has discomfort into the left leg with prolong standing and walking improved with sitting. If I can sit I would have no problem at  all. Takes 5 advil in the AM to relieve low back pain and this allows improved function and ability to stand and walk. He sleeps on abdomen prone uses a pillow to roll slightly to the side. No bowel or bladder difficulty. Walking is limited to 1 block. He is requesting consideration of ESI. Pain is mainly left upper buttock into the left lateral thigh and into the left lateral calf. He is  3 months post op left THR. History of cardiac stent on plavix, aspirin 81 mg and antihypertension agents.    Review of Systems  Constitutional: Negative.   HENT: Negative.   Eyes: Negative.   Respiratory: Negative.   Cardiovascular: Negative.   Gastrointestinal: Negative.   Endocrine: Negative.   Genitourinary: Negative.   Musculoskeletal: Negative.   Skin: Negative.   Allergic/Immunologic: Negative.   Neurological: Negative.   Hematological: Negative.   Psychiatric/Behavioral: Negative.      Objective: Vital Signs: BP (!) 178/95   Pulse 70   Ht 6' (1.829 m)   Wt 265 lb (120.2 kg)   BMI 35.94 kg/m   Physical Exam Constitutional:      Appearance: He is well-developed.  HENT:     Head: Normocephalic and atraumatic.  Eyes:     Pupils: Pupils are equal, round, and reactive to  light.  Neck:     Musculoskeletal: Normal range of motion and neck supple.  Pulmonary:     Effort: Pulmonary effort is normal.     Breath sounds: Normal breath sounds.  Abdominal:     General: Bowel sounds are normal.     Palpations: Abdomen is soft.  Skin:    General: Skin is warm and dry.  Neurological:     Mental Status: He is alert and oriented to person, place, and time.  Psychiatric:        Behavior: Behavior normal.        Thought Content: Thought content normal.        Judgment: Judgment normal.     Back Exam   Tenderness  The patient is experiencing tenderness in the lumbar.  Range of Motion  Extension:  10 abnormal  Flexion: 80  Lateral bend right: normal  Lateral bend left: normal   Rotation right:  30 abnormal  Rotation left:  30 abnormal   Muscle Strength  Right Quadriceps:  5/5  Left Quadriceps:  5/5  Right Hamstrings:  5/5  Left Hamstrings:  5/5   Tests  Straight leg raise right: negative Straight leg raise left: negative  Reflexes  Patellar: 0/4 Achilles: 0/4 Babinski's sign: normal   Other  Toe walk: normal Heel walk: normal Erythema: no back redness Scars: present  Comments:  SLR negative bilaterally, ambulates with left leg limp. EHL strength is normal, foot DF normal, no forcal deffo      Specialty Comments:  No specialty comments available.  Imaging: No results found.   PMFS History: Patient Active Problem List   Diagnosis Date Noted  . Status post total replacement of right hip 05/10/2018  . Unilateral primary osteoarthritis, right hip 04/20/2018  . Class 2 obesity due to excess calories with serious comorbidity and body mass index (BMI) of 37.0 to 37.9 in adult 03/27/2016  . IFG (impaired fasting glucose) 01/24/2016  . Hypertension   . Hyperlipidemia   . CAD (coronary artery disease)   . Cervical spondylosis with myelopathy and radiculopathy 01/08/2014   Past Medical History:  Diagnosis Date  . Arthritis   . Back pain   . CAD (coronary artery disease)   . Cervical spondylosis with myelopathy   . GERD (gastroesophageal reflux disease)   . H/O urinary frequency   . Hyperlipidemia   . Hypertension   . Myocardial infarction [facility]) 2011   caused VF arresr; s/p LAD bare metal stent; Sees Dr Romeo Apple @ [facility] annualyy s/p stent    Family History  Problem Relation Age of Onset  . Heart disease Mother   . Heart disease Maternal Grandfather   . Heart disease Paternal Grandmother   . Heart disease Paternal Grandfather     Past Surgical History:  Procedure Laterality Date  . ANTERIOR CERVICAL DECOMP/DISCECTOMY FUSION N/A 01/08/2014   Procedure: ANTERIOR CERVICAL DECOMPRESSION/DISCECTOMY FUSION 2 LEVELS;  Surgeon: Tressie Stalker, MD;  Location: [facility] NEURO ORS;  Service: Neurosurgery;  Laterality: N/A;  Cervical Five-Six/Six-Seven Anterior Cervical Decompression with Fusoin interbody prosthesis plating and bonegraft  . CORONARY ANGIOPLASTY  [id number]   Dr Bernette Redbird at [facility]  . TONSILLECTOMY    . TOTAL HIP ARTHROPLASTY Right 05/10/2018  . TOTAL HIP ARTHROPLASTY Right 05/10/2018   Procedure: RIGHT TOTAL HIP ARTHROPLASTY ANTERIOR APPROACH;  Surgeon: Kathryne Hitch, MD;  Location: [facility] OR;  Service: Orthopedics;  Laterality: Right;  . WISDOM TOOTH EXTRACTION     Social History   Occupational  History  . Not on file  Tobacco Use  . Smoking status: Former Smoker    Types: Cigars    Last attempt to quit: 01/01/2013    Years since quitting: 5.6  . Smokeless tobacco: Never Used  Substance and Sexual Activity  . Alcohol use: Yes    Alcohol/week: 1.0 standard drinks    Types: 1 Shots of liquor per week    Comment: per night  . Drug use: No  . Sexual activity: Yes

## 2018-10-25 ENCOUNTER — Ambulatory Visit (INDEPENDENT_AMBULATORY_CARE_PROVIDER_SITE_OTHER): Payer: BLUE CROSS/BLUE SHIELD | Admitting: Physical Medicine and Rehabilitation

## 2018-10-25 ENCOUNTER — Other Ambulatory Visit: Payer: Self-pay

## 2018-10-25 ENCOUNTER — Encounter: Payer: Self-pay | Admitting: Physical Medicine and Rehabilitation

## 2018-10-25 ENCOUNTER — Ambulatory Visit: Payer: Self-pay

## 2018-10-25 ENCOUNTER — Telehealth: Payer: Self-pay | Admitting: Radiology

## 2018-10-25 VITALS — BP 170/96 | HR 69 | Ht 72.0 in | Wt 265.0 lb

## 2018-10-25 DIAGNOSIS — M5416 Radiculopathy, lumbar region: Secondary | ICD-10-CM

## 2018-10-25 MED ORDER — BETAMETHASONE SOD PHOS & ACET 6 (3-3) MG/ML IJ SUSP
12.0000 mg | Freq: Once | INTRAMUSCULAR | Status: AC
  Administered 2018-10-25: 12 mg

## 2018-10-25 NOTE — Telephone Encounter (Signed)
Patient would like to know if he can use an inversion table for his back with the hip replacement?

## 2018-10-25 NOTE — Telephone Encounter (Signed)
I called and lmom to let him know it was fine to use the inversion table

## 2018-10-25 NOTE — Progress Notes (Signed)
 .  Numeric Pain Rating Scale and Functional Assessment Average Pain 9   In the last MONTH (on 0-10 scale) has pain interfered with the following?  1. General activity like being  able to carry out your everyday physical activities such as walking, climbing stairs, carrying groceries, or moving a chair?  Rating(5)   +Driver, -BT, -Dye Allergies.  

## 2018-10-25 NOTE — Telephone Encounter (Signed)
Yes, that will be fine to use an inversion table.

## 2018-10-26 ENCOUNTER — Encounter: Payer: Self-pay | Admitting: Physical Medicine and Rehabilitation

## 2018-10-26 NOTE — Progress Notes (Signed)
Eddie Alexander - 58 y.o. male MRN [id number]  Date of birth: 1960/10/11  Office Visit Note: Visit Date: 10/25/2018 PCP: Steele Sizerrissman, Mark A, MD Referred by: Steele Sizerrissman, Mark A, MD  Subjective: Chief Complaint  Patient presents with  . Lower Back - Pain  . Left Hip - Pain  . Left Leg - Weakness   HPI:  Eddie Alexander is a 58 y.o. male who comes in today At the request of Dr. Vira BrownsJames Nitka for left L4 transforaminal epidural steroid injection for chronic worsening 6 months or more history of left hip and leg pain.  Patient had prior right total hip arthroplasty by Dr. Magnus IvanBlackman.  I actually saw the patient before his arthroplasty completed right hip injection.  This injection today will be diagnostic and hopefully therapeutic.  He is really failed conservative care including chiropractic care and medication management.  ROS Otherwise per HPI.  Assessment & Plan: Visit Diagnoses:  1. Lumbar radiculopathy     Plan: No additional findings.   Meds & Orders:  Meds ordered this encounter  Medications  . betamethasone acetate-betamethasone sodium phosphate (CELESTONE) injection 12 mg    Orders Placed This Encounter  Procedures  . XR C-ARM NO REPORT  . Epidural Steroid injection    Follow-up: Return if symptoms worsen or fail to improve, for Vira BrownsJames Nitka, M.D..   Procedures: No procedures performed  Lumbosacral Transforaminal Epidural Steroid Injection - Sub-Pedicular Approach with Fluoroscopic Guidance  Patient: Eddie Alexander      Date of Birth: 1960/10/11 MRN: [id number] PCP: Steele Sizerrissman, Mark A, MD      Visit Date: 10/25/2018   Universal Protocol:    Date/Time: 10/25/2018  Consent Given By: the patient  Position: PRONE  Additional Comments: Vital signs were monitored before and after the procedure. Patient was prepped and draped in the usual sterile fashion. The correct patient, procedure, and site was verified.   Injection Procedure Details:  Procedure Site One Meds  Administered:  Meds ordered this encounter  Medications  . betamethasone acetate-betamethasone sodium phosphate (CELESTONE) injection 12 mg    Laterality: Left  Location/Site:  L4-L5  Needle size: 22 G  Needle type: Spinal  Needle Placement: Transforaminal  Findings:    -Comments: Excellent flow of contrast along the nerve and into the epidural space.  Procedure Details: After squaring off the end-plates to get a true AP view, the C-arm was positioned so that an oblique view of the foramen as noted above was visualized. The target area is just inferior to the "nose of the scotty dog" or sub pedicular. The soft tissues overlying this structure were infiltrated with 2-3 ml. of 1% Lidocaine without Epinephrine.  The spinal needle was inserted toward the target using a "trajectory" view along the fluoroscope beam.  Under AP and lateral visualization, the needle was advanced so it did not puncture dura and was located close the 6 O'Clock position of the pedical in AP tracterory. Biplanar projections were used to confirm position. Aspiration was confirmed to be negative for CSF and/or blood. A 1-2 ml. volume of Isovue-250 was injected and flow of contrast was noted at each level. Radiographs were obtained for documentation purposes.   After attaining the desired flow of contrast documented above, a 0.5 to 1.0 ml test dose of 0.25% Marcaine was injected into each respective transforaminal space.  The patient was observed for 90 seconds post injection.  After no sensory deficits were reported, and normal lower extremity motor function was noted,  the above injectate was administered so that equal amounts of the injectate were placed at each foramen (level) into the transforaminal epidural space.   Additional Comments:  The patient tolerated the procedure well Dressing: 2 x 2 sterile gauze and Band-Aid    Post-procedure details: Patient was observed during the procedure. Post-procedure  instructions were reviewed.  Patient left the clinic in stable condition.     Clinical History: CTL CT-Myelogram IMPRESSION: CERVICAL: Status post C5-C7 ACDF. No adverse features. Asymmetric foraminal narrowing at C3-4 on the LEFT could affect the LEFT C4 nerve root. No significant adjacent segment disease at the C4-5 level. No significant spinal stenosis or cord compression.  THORACIC: Unremarkable thoracic myelogram and postmyelogram CT.  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 With regard to foot drop, potentially symptomatic RIGHT L5 neural impingement could occur at the L4-5 level due to subarticular zone narrowing, or at L5-S1 due to foraminal narrowing.   Electronically Signed   By: Elsie Stain M.D.   On: 03/28/2018 12:57     Objective:  VS:  HT:6' (182.9 cm)   WT:265 lb (120.2 kg)  BMI:35.93    BP:(!) 170/96  HR:69bpm  TEMP: ( )  RESP:97 % Physical Exam  Ortho Exam Imaging: Xr C-arm No Report  Result Date: 10/25/2018 Please see Notes tab for imaging impression.

## 2018-10-26 NOTE — Procedures (Signed)
Lumbosacral Transforaminal Epidural Steroid Injection - Sub-Pedicular Approach with Fluoroscopic Guidance  Patient: Eddie Alexander      Date of Birth: 11-24-1960 MRN: [id number] PCP: Steele Sizer, MD      Visit Date: 10/25/2018   Universal Protocol:    Date/Time: 10/25/2018  Consent Given By: the patient  Position: PRONE  Additional Comments: Vital signs were monitored before and after the procedure. Patient was prepped and draped in the usual sterile fashion. The correct patient, procedure, and site was verified.   Injection Procedure Details:  Procedure Site One Meds Administered:  Meds ordered this encounter  Medications  . betamethasone acetate-betamethasone sodium phosphate (CELESTONE) injection 12 mg    Laterality: Left  Location/Site:  L4-L5  Needle size: 22 G  Needle type: Spinal  Needle Placement: Transforaminal  Findings:    -Comments: Excellent flow of contrast along the nerve and into the epidural space.  Procedure Details: After squaring off the end-plates to get a true AP view, the C-arm was positioned so that an oblique view of the foramen as noted above was visualized. The target area is just inferior to the "nose of the scotty dog" or sub pedicular. The soft tissues overlying this structure were infiltrated with 2-3 ml. of 1% Lidocaine without Epinephrine.  The spinal needle was inserted toward the target using a "trajectory" view along the fluoroscope beam.  Under AP and lateral visualization, the needle was advanced so it did not puncture dura and was located close the 6 O'Clock position of the pedical in AP tracterory. Biplanar projections were used to confirm position. Aspiration was confirmed to be negative for CSF and/or blood. A 1-2 ml. volume of Isovue-250 was injected and flow of contrast was noted at each level. Radiographs were obtained for documentation purposes.   After attaining the desired flow of contrast documented above, a 0.5 to  1.0 ml test dose of 0.25% Marcaine was injected into each respective transforaminal space.  The patient was observed for 90 seconds post injection.  After no sensory deficits were reported, and normal lower extremity motor function was noted,   the above injectate was administered so that equal amounts of the injectate were placed at each foramen (level) into the transforaminal epidural space.   Additional Comments:  The patient tolerated the procedure well Dressing: 2 x 2 sterile gauze and Band-Aid    Post-procedure details: Patient was observed during the procedure. Post-procedure instructions were reviewed.  Patient left the clinic in stable condition.

## 2018-10-29 ENCOUNTER — Telehealth: Payer: Self-pay | Admitting: Internal Medicine

## 2018-10-29 NOTE — Telephone Encounter (Signed)
Attempted to call patient and schedule an appointment. No answer, left message for him to call back [phone] and speak to someone.

## 2018-12-22 ENCOUNTER — Encounter: Payer: Self-pay | Admitting: Orthopaedic Surgery

## 2018-12-22 ENCOUNTER — Ambulatory Visit: Payer: Self-pay

## 2018-12-22 ENCOUNTER — Other Ambulatory Visit: Payer: Self-pay

## 2018-12-22 ENCOUNTER — Ambulatory Visit (INDEPENDENT_AMBULATORY_CARE_PROVIDER_SITE_OTHER): Payer: BC Managed Care – PPO | Admitting: Orthopaedic Surgery

## 2018-12-22 DIAGNOSIS — Z96641 Presence of right artificial hip joint: Secondary | ICD-10-CM

## 2018-12-22 DIAGNOSIS — M545 Low back pain, unspecified: Secondary | ICD-10-CM

## 2018-12-22 MED ORDER — DIAZEPAM 5 MG PO TABS: 5.0000 mg | ORAL_TABLET | Freq: Every day | ORAL | 0 refills | Status: AC | PRN

## 2018-12-22 NOTE — Addendum Note (Signed)
Addended byLaurann Montana on: 12/22/2018 04:58 PM   Modules accepted: Orders

## 2018-12-22 NOTE — Progress Notes (Signed)
The patient is now 6 months status post a right total hip arthroplasty.  He is doing well.  He says he can tie his shoes and put on his shoes and socks much better than what he could before surgery.  He is very pleased with his hip overall.  In May of this year he did have a left-sided L4-L5 epidural steroid injection by Dr. Ernestina Patches.  He is a patient of Dr. Louanne Skye for his spine.  He says that injection is done well but he still has some weakness in his left lower extremity and would like to have at least one more injection in his lumbar spine if that is reasonable.  On examination of his right operative hip he has fluid range of motion of that hip that is much improved than what he had preoperatively.  X-rays of the right hip shows a well-seated implant with no complicating features.  There is some arthritic changes in his left hip but his left hip on exam moves smoothly and has no issues at all.  He does seem to just have some residual weakness in his left leg in general.  From my standpoint I do not need to see him back for 6 months.  At that visit I would like just a standing low AP pelvis.  We will set him up for seeing Dr. Ernestina Patches again in about 2 to 3 weeks for a repeat left-sided L4-L5 injection.

## 2019-01-12 ENCOUNTER — Ambulatory Visit: Payer: Self-pay

## 2019-01-12 ENCOUNTER — Ambulatory Visit (INDEPENDENT_AMBULATORY_CARE_PROVIDER_SITE_OTHER): Payer: BC Managed Care – PPO | Admitting: Physical Medicine and Rehabilitation

## 2019-01-12 ENCOUNTER — Encounter: Payer: Self-pay | Admitting: Physical Medicine and Rehabilitation

## 2019-01-12 ENCOUNTER — Other Ambulatory Visit: Payer: Self-pay

## 2019-01-12 ENCOUNTER — Telehealth: Payer: Self-pay | Admitting: *Deleted

## 2019-01-12 VITALS — BP 176/108 | HR 65

## 2019-01-12 DIAGNOSIS — M5416 Radiculopathy, lumbar region: Secondary | ICD-10-CM

## 2019-01-12 MED ORDER — BETAMETHASONE SOD PHOS & ACET 6 (3-3) MG/ML IJ SUSP: 12.0000 mg | Freq: Once | INTRAMUSCULAR | Status: DC

## 2019-01-12 NOTE — Telephone Encounter (Signed)
Can you please ask Dr Nitka about this? 

## 2019-01-12 NOTE — Telephone Encounter (Signed)
Please advise 

## 2019-01-12 NOTE — Progress Notes (Signed)
 .  Numeric Pain Rating Scale and Functional Assessment Average Pain 8   In the last MONTH (on 0-10 scale) has pain interfered with the following?  1. General activity like being  able to carry out your everyday physical activities such as walking, climbing stairs, carrying groceries, or moving a chair?  Rating(9)   -Driver(pt states his wife will pick him up), -BT, -Dye Allergies.

## 2019-01-13 NOTE — Telephone Encounter (Signed)
Can you address for patient or hold for Dr. Louanne Skye?

## 2019-01-13 NOTE — Telephone Encounter (Signed)
I called discussed. Claudication Sx, gets relief when he sits down .

## 2019-01-13 NOTE — Telephone Encounter (Signed)
Noted  

## 2019-02-13 ENCOUNTER — Telehealth: Payer: Self-pay | Admitting: Orthopaedic Surgery

## 2019-02-13 NOTE — Telephone Encounter (Signed)
He probably needs an appt with Artis Delay or Ninfa Linden, I think the left hip is a "new" issue

## 2019-02-13 NOTE — Telephone Encounter (Signed)
Pt called in said he is having pain on his left hip area and is wanting to know if dr.blackman recommends he see dr.newton or what should he do?  [phone]

## 2019-02-14 NOTE — Telephone Encounter (Signed)
Pt has made an appt with dr.blackman tomorrow 02-15-2019 at 12:45.

## 2019-02-15 ENCOUNTER — Encounter: Payer: Self-pay | Admitting: Orthopaedic Surgery

## 2019-02-15 ENCOUNTER — Ambulatory Visit: Payer: Self-pay

## 2019-02-15 ENCOUNTER — Ambulatory Visit (INDEPENDENT_AMBULATORY_CARE_PROVIDER_SITE_OTHER): Payer: BC Managed Care – PPO | Admitting: Orthopaedic Surgery

## 2019-02-15 DIAGNOSIS — M1612 Unilateral primary osteoarthritis, left hip: Secondary | ICD-10-CM

## 2019-02-15 DIAGNOSIS — M25552 Pain in left hip: Secondary | ICD-10-CM | POA: Diagnosis not present

## 2019-02-15 NOTE — Progress Notes (Signed)
Office Visit Note   Patient: Eddie Alexander           Date of Birth: 1960-08-12           MRN: [id number] Visit Date: 02/15/2019              Requested by: Guadalupe Maple, MD [address] [place],  [place] [address] PCP: Guadalupe Maple, MD   Assessment & Plan: Visit Diagnoses:  1. Pain in left hip   2. Primary osteoarthritis of left hip     Plan: Eddie Alexander does not want to undergo surgery at this point time and is requesting an intra-articular injection in his left hip.  Therefore we will set him up with Dr. Junius Roads to have this done under ultrasound.  We will see him back in 4 weeks to see what type of response he had to the injection.  Follow-Up Instructions: Return in about 4 weeks (around 03/15/2019).   Orders:  Orders Placed This Encounter  Procedures  . XR HIP UNILAT W OR W/O PELVIS 1V LEFT   No orders of the defined types were placed in this encounter.     Procedures: No procedures performed   Clinical Data: No additional findings.   Subjective: Chief Complaint  Patient presents with  . Left Hip - Pain    HPI Eddie Alexander is 58 year old male well-known to Dr. Ninfa Linden service comes in today with increasing left hip pain.  He states he is having decreased range of motion in the hip and increasing pain.  Using no assistive device.  He has had no new injury to the hip.  Review of Systems Denies any fevers chills shortness of breath chest pain  Objective: Vital Signs: There were no vitals taken for this visit.  Physical Exam Constitutional:      Appearance: He is not ill-appearing or diaphoretic.  Pulmonary:     Effort: Pulmonary effort is normal.  Neurological:     Mental Status: He is alert and oriented to person, place, and time.  Psychiatric:        Mood and Affect: Mood normal.        Behavior: Behavior normal.     Ortho Exam Right hip excellent range of motion without pain.  Left hip no internal rotation minimal external rotation both  with pain.  Unable to flex left hip secondary to pain. Specialty Comments:  No specialty comments available.  Imaging: Xr Hip Unilat W Or W/o Pelvis 1v Left  Result Date: 02/15/2019 AP pelvis lateral view of the left hip: Shows diminished joint space compared to prior films.  No acute fractures.  Well-seated right total hip arthroplasty without complication.    PMFS History: Patient Active Problem List   Diagnosis Date Noted  . Status post total replacement of right hip 05/10/2018  . Unilateral primary osteoarthritis, right hip 04/20/2018  . Class 2 obesity due to excess calories with serious comorbidity and body mass index (BMI) of 37.0 to 37.9 in adult 03/27/2016  . IFG (impaired fasting glucose) 01/24/2016  . Hypertension   . Hyperlipidemia   . CAD (coronary artery disease)   . Cervical spondylosis with myelopathy and radiculopathy 01/08/2014   Past Medical History:  Diagnosis Date  . Arthritis   . Back pain   . CAD (coronary artery disease)   . Cervical spondylosis with myelopathy   . GERD (gastroesophageal reflux disease)   . H/O urinary frequency   . Hyperlipidemia   . Hypertension   .  Myocardial infarction [facility]) 2011   caused VF arresr; s/p LAD bare metal stent; Sees Dr Romeo AppleHarrison @ [facility] annualyy s/p stent    Family History  Problem Relation Age of Onset  . Heart disease Mother   . Heart disease Maternal Grandfather   . Heart disease Paternal Grandmother   . Heart disease Paternal Grandfather     Past Surgical History:  Procedure Laterality Date  . ANTERIOR CERVICAL DECOMP/DISCECTOMY FUSION N/A 01/08/2014   Procedure: ANTERIOR CERVICAL DECOMPRESSION/DISCECTOMY FUSION 2 LEVELS;  Surgeon: Tressie StalkerJeffrey Jenkins, MD;  Location: [facility];  Service: Neurosurgery;  Laterality: N/A;  Cervical Five-Six/Six-Seven Anterior Cervical Decompression with Fusoin interbody prosthesis plating and bonegraft  . CORONARY ANGIOPLASTY  [id number]   [facility]  .  TONSILLECTOMY    . TOTAL HIP ARTHROPLASTY Right 05/10/2018  . TOTAL HIP ARTHROPLASTY Right 05/10/2018   Procedure: RIGHT TOTAL HIP ARTHROPLASTY ANTERIOR APPROACH;  Surgeon: Kathryne HitchBlackman, Christopher Y, MD;  Location: [facility] OR;  Service: Orthopedics;  Laterality: Right;  . WISDOM TOOTH EXTRACTION     Social History   Occupational History  . Not on file  Tobacco Use  . Smoking status: Former Smoker    Types: Cigars    Quit date: 01/01/2013    Years since quitting: 6.1  . Smokeless tobacco: Never Used  Substance and Sexual Activity  . Alcohol use: Yes    Alcohol/week: 1.0 standard drinks    Types: 1 Shots of liquor per week    Comment: per night  . Drug use: No  . Sexual activity: Yes

## 2019-02-15 NOTE — Progress Notes (Signed)
Subjective: Patient is here for ultrasound-guided intra-articular left hip injection.   Left groin pain, feels very similar to prior to having his right hip replaced.  He would like to try an injection before surgery.  Objective: Pain with passive internal rotation.  Procedure: Ultrasound-guided left hip injection: After sterile prep with Betadine, injected 8 cc 1% lidocaine without epinephrine and 40 mg methylprednisolone using a 22-gauge spinal needle, passing the needle through the iliofemoral ligament into the femoral head/neck junction.  Injectate was seen filling the joint capsule.  He had very good immediate relief.  He will follow-up as directed.

## 2019-02-27 NOTE — Procedures (Signed)
Lumbosacral Transforaminal Epidural Steroid Injection - Sub-Pedicular Approach with Fluoroscopic Guidance  Patient: Eddie Alexander      Date of Birth: March 21, 1961 MRN: [id number] PCP: Guadalupe Maple, MD      Visit Date: 01/12/2019   Universal Protocol:    Date/Time: 01/12/2019  Consent Given By: the patient  Position: PRONE  Additional Comments: Vital signs were monitored before and after the procedure. Patient was prepped and draped in the usual sterile fashion. The correct patient, procedure, and site was verified.   Injection Procedure Details:  Procedure Site One Meds Administered:  Meds ordered this encounter  Medications  . betamethasone acetate-betamethasone sodium phosphate (CELESTONE) injection 12 mg    Laterality: Left  Location/Site:  L4-L5  Needle size: 22 G  Needle type: Spinal  Needle Placement: Transforaminal  Findings:    -Comments: Excellent flow of contrast along the nerve and into the epidural space.  Procedure Details: After squaring off the end-plates to get a true AP view, the C-arm was positioned so that an oblique view of the foramen as noted above was visualized. The target area is just inferior to the "nose of the scotty dog" or sub pedicular. The soft tissues overlying this structure were infiltrated with 2-3 ml. of 1% Lidocaine without Epinephrine.  The spinal needle was inserted toward the target using a "trajectory" view along the fluoroscope beam.  Under AP and lateral visualization, the needle was advanced so it did not puncture dura and was located close the 6 O'Clock position of the pedical in AP tracterory. Biplanar projections were used to confirm position. Aspiration was confirmed to be negative for CSF and/or blood. A 1-2 ml. volume of Isovue-250 was injected and flow of contrast was noted at each level. Radiographs were obtained for documentation purposes.   After attaining the desired flow of contrast documented above, a 0.5 to  1.0 ml test dose of 0.25% Marcaine was injected into each respective transforaminal space.  The patient was observed for 90 seconds post injection.  After no sensory deficits were reported, and normal lower extremity motor function was noted,   the above injectate was administered so that equal amounts of the injectate were placed at each foramen (level) into the transforaminal epidural space.   Additional Comments:  The patient tolerated the procedure well Dressing: 2 x 2 sterile gauze and Band-Aid    Post-procedure details: Patient was observed during the procedure. Post-procedure instructions were reviewed.  Patient left the clinic in stable condition.

## 2019-02-27 NOTE — Progress Notes (Signed)
Eddie Alexander - 58 y.o. male MRN [id number]  Date of birth: [birth year]  Office Visit Note: Visit Date: 01/12/2019 PCP: Guadalupe Maple, MD Referred by: Guadalupe Maple, MD  Subjective: Chief Complaint  Patient presents with  . Lower Back - Pain  . Left Leg - Pain   HPI:  Eddie Alexander is a 58 y.o. male who comes in today For repeat left L4 transforaminal epidural steroid injection.  Patient reports good relief with prior injection in June.  He got more than 2 months of relief and the symptoms have returned.  He is followed by both Dr. Ninfa Linden and Dr. Louanne Skye in the office.  He will return to Dr. Louanne Skye for review of his lumbar spine depending on relief with injection.  ROS Otherwise per HPI.  Assessment & Plan: Visit Diagnoses:  1. Lumbar radiculopathy     Plan: No additional findings.   Meds & Orders:  Meds ordered this encounter  Medications  . betamethasone acetate-betamethasone sodium phosphate (CELESTONE) injection 12 mg    Orders Placed This Encounter  Procedures  . XR C-ARM NO REPORT  . Epidural Steroid injection    Follow-up: Return if symptoms worsen or fail to improve.   Procedures: No procedures performed  Lumbosacral Transforaminal Epidural Steroid Injection - Sub-Pedicular Approach with Fluoroscopic Guidance  Patient: Eddie Alexander      Date of Birth: [birth year] MRN: [id number] PCP: Guadalupe Maple, MD      Visit Date: 01/12/2019   Universal Protocol:    Date/Time: 01/12/2019  Consent Given By: the patient  Position: PRONE  Additional Comments: Vital signs were monitored before and after the procedure. Patient was prepped and draped in the usual sterile fashion. The correct patient, procedure, and site was verified.   Injection Procedure Details:  Procedure Site One Meds Administered:  Meds ordered this encounter  Medications  . betamethasone acetate-betamethasone sodium phosphate (CELESTONE) injection 12 mg    Laterality: Left   Location/Site:  L4-L5  Needle size: 22 G  Needle type: Spinal  Needle Placement: Transforaminal  Findings:    -Comments: Excellent flow of contrast along the nerve and into the epidural space.  Procedure Details: After squaring off the end-plates to get a true AP view, the C-arm was positioned so that an oblique view of the foramen as noted above was visualized. The target area is just inferior to the "nose of the scotty dog" or sub pedicular. The soft tissues overlying this structure were infiltrated with 2-3 ml. of 1% Lidocaine without Epinephrine.  The spinal needle was inserted toward the target using a "trajectory" view along the fluoroscope beam.  Under AP and lateral visualization, the needle was advanced so it did not puncture dura and was located close the 6 O'Clock position of the pedical in AP tracterory. Biplanar projections were used to confirm position. Aspiration was confirmed to be negative for CSF and/or blood. A 1-2 ml. volume of Isovue-250 was injected and flow of contrast was noted at each level. Radiographs were obtained for documentation purposes.   After attaining the desired flow of contrast documented above, a 0.5 to 1.0 ml test dose of 0.25% Marcaine was injected into each respective transforaminal space.  The patient was observed for 90 seconds post injection.  After no sensory deficits were reported, and normal lower extremity motor function was noted,   the above injectate was administered so that equal amounts of the injectate were placed at each foramen (level) into the transforaminal  epidural space.   Additional Comments:  The patient tolerated the procedure well Dressing: 2 x 2 sterile gauze and Band-Aid    Post-procedure details: Patient was observed during the procedure. Post-procedure instructions were reviewed.  Patient left the clinic in stable condition.    Clinical History: CTL CT-Myelogram IMPRESSION: CERVICAL: Status post C5-C7 ACDF. No  adverse features. Asymmetric foraminal narrowing at C3-4 on the LEFT could affect the LEFT C4 nerve root. No significant adjacent segment disease at the C4-5 level. No significant spinal stenosis or cord compression.  THORACIC: Unremarkable thoracic myelogram and postmyelogram CT.  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 With regard to foot drop, potentially symptomatic RIGHT L5 neural impingement could occur at the L4-5 level due to subarticular zone narrowing, or at L5-S1 due to foraminal narrowing.   Electronically Signed   By: Elsie Stain M.D.   On: 03/28/2018 12:57     Objective:  VS:  HT:    WT:   BMI:     BP:(!) 176/108  HR:65bpm  TEMP: ( )  RESP:  Physical Exam  Ortho Exam Imaging: No results found.

## 2019-03-15 ENCOUNTER — Other Ambulatory Visit: Payer: Self-pay | Admitting: Orthopaedic Surgery

## 2019-03-15 ENCOUNTER — Encounter: Payer: Self-pay | Admitting: Orthopaedic Surgery

## 2019-03-15 ENCOUNTER — Ambulatory Visit (INDEPENDENT_AMBULATORY_CARE_PROVIDER_SITE_OTHER): Payer: BC Managed Care – PPO | Admitting: Orthopaedic Surgery

## 2019-03-15 ENCOUNTER — Other Ambulatory Visit: Payer: Self-pay

## 2019-03-15 DIAGNOSIS — M25552 Pain in left hip: Secondary | ICD-10-CM

## 2019-03-15 DIAGNOSIS — M1612 Unilateral primary osteoarthritis, left hip: Secondary | ICD-10-CM

## 2019-03-15 MED ORDER — OXYCODONE HCL 5 MG PO TABS: 5.0000 mg | ORAL_TABLET | Freq: Two times a day (BID) | ORAL | 0 refills | Status: DC | PRN

## 2019-03-15 NOTE — Progress Notes (Signed)
Office Visit Note   Patient: Eddie Alexander           Date of Birth: [birth year]           MRN: [id number] Visit Date: 03/15/2019              Requested by: Steele Sizer, MD [address] [place],  [place] [address] PCP: Steele Sizer, MD   Assessment & Plan: Visit Diagnoses:  1. Pain in left hip   2. Primary osteoarthritis of left hip     Plan: At this point given the severity of his left hip arthritis combined with his clinical exam findings and the severe pain he is having, a left total hip arthroplasty is warranted.  Also he is tried and failed all forms of conservative treatment for over a year on his left hip.  Having had the surgery before he is fully aware of the risk and benefits of surgery.  He is aware of his interoperative and postoperative course as well as the goals of surgery.  All questions concerns were answered and addressed.  We will work on getting this scheduled.  Follow-Up Instructions: Return for 2 weeks post-op.   Orders:  No orders of the defined types were placed in this encounter.  No orders of the defined types were placed in this encounter.     Procedures: No procedures performed   Clinical Data: No additional findings.   Subjective: Chief Complaint  Patient presents with   Left Hip - Follow-up  The patient is well-known to [place].  He is 10 months status post a right total hip arthroplasty.  His left hip does have severe end-stage arthritis in it.  We did have him try an intra-articular steroid injection under radiographic guidance last month and the left hip joint.  He said that did not really help him much at all.  His x-rays do correlate with severe end-stage arthritis.  At this point his left hip pain is daily and it is detrimentally affecting his mobility, his quality of life and his actives daily living.  His left hip pain is daily and it is 10 out of 10.  He has had no other acute changes in medical status.  He is done well with his  right total hip arthroplasty.  He is failed conservative treatment on the left hip with activity modification, weight loss, hip strengthening exercises and guided physical therapy, anti-inflammatories and an intra-articular steroid injection.  At this point again he would like to proceed with a total hip arthroplasty.  HPI  Review of Systems He currently denies any headache, chest pain, shortness of breath, fever, chills, nausea, vomiting  Objective: Vital Signs: There were no vitals taken for this visit.  Physical Exam He is alert and oriented x3 and in no acute distress Ortho Exam Examination of his right operative hip shows that it moves fluidly and normally.  Examination of his left hip shows significant stiffness with internal and external rotation.  There is a lot of pain in his hip on the left side with rotation as well. Specialty Comments:  No specialty comments available.  Imaging: No results found. Previous x-rays do confirm end-stage arthritis of the left hip with complete loss of the superior lateral joint space.  There is significant periarticular osteophytes and sclerotic changes.  PMFS History: Patient Active Problem List   Diagnosis Date Noted   Status post total replacement of right hip 05/10/2018   Unilateral primary osteoarthritis,  right hip 04/20/2018   Class 2 obesity due to excess calories with serious comorbidity and body mass index (BMI) of 37.0 to 37.9 in adult 03/27/2016   IFG (impaired fasting glucose) 01/24/2016   Hypertension    Hyperlipidemia    CAD (coronary artery disease)    Cervical spondylosis with myelopathy and radiculopathy 01/08/2014   Past Medical History:  Diagnosis Date   Arthritis    Back pain    CAD (coronary artery disease)    Cervical spondylosis with myelopathy    GERD (gastroesophageal reflux disease)    H/O urinary frequency    Hyperlipidemia    Hypertension    Myocardial infarction [facility]) 2011   caused VF  arresr; s/p LAD bare metal stent; Sees Dr Aline Brochure @ 89 annualyy s/p stent    Family History  Problem Relation Age of Onset   Heart disease Mother    Heart disease Maternal Grandfather    Heart disease Paternal Grandmother    Heart disease Paternal Grandfather     Past Surgical History:  Procedure Laterality Date   ANTERIOR CERVICAL DECOMP/DISCECTOMY FUSION N/A 01/08/2014   Procedure: ANTERIOR CERVICAL DECOMPRESSION/DISCECTOMY FUSION 2 LEVELS;  Surgeon: Newman Pies, MD;  Location: [facility];  Service: Neurosurgery;  Laterality: N/A;  Cervical Five-Six/Six-Seven Anterior Cervical Decompression with Fusoin interbody prosthesis plating and bonegraft   CORONARY ANGIOPLASTY  2011,2013   Dr Jenne Pane at [facility] Right 05/10/2018   TOTAL HIP ARTHROPLASTY Right 05/10/2018   Procedure: RIGHT TOTAL HIP ARTHROPLASTY ANTERIOR APPROACH;  Surgeon: Mcarthur Rossetti, MD;  Location: [facility];  Service: Orthopedics;  Laterality: Right;   WISDOM TOOTH EXTRACTION     Social History   Occupational History   Not on file  Tobacco Use   Smoking status: Former Smoker    Types: Cigars    Quit date: 01/01/2013    Years since quitting: 6.2   Smokeless tobacco: Never Used  Substance and Sexual Activity   Alcohol use: Yes    Alcohol/week: 1.0 standard drinks    Types: 1 Shots of liquor per week    Comment: per night   Drug use: No   Sexual activity: Yes

## 2019-04-12 ENCOUNTER — Other Ambulatory Visit (INDEPENDENT_AMBULATORY_CARE_PROVIDER_SITE_OTHER): Payer: Self-pay | Admitting: Specialist

## 2019-04-13 ENCOUNTER — Telehealth: Payer: Self-pay | Admitting: Specialist

## 2019-04-13 NOTE — Telephone Encounter (Signed)
This is already pending approval from Dr. Louanne Skye

## 2019-04-13 NOTE — Telephone Encounter (Signed)
Patient called left voicemail message needing Rx refilled (Diclofenac) [facility] [address] [facility] [place]   The number to contat patient is [phone]

## 2019-04-16 ENCOUNTER — Other Ambulatory Visit (INDEPENDENT_AMBULATORY_CARE_PROVIDER_SITE_OTHER): Payer: Self-pay | Admitting: Specialist

## 2019-04-17 ENCOUNTER — Telehealth: Payer: Self-pay | Admitting: Orthopaedic Surgery

## 2019-04-17 ENCOUNTER — Telehealth: Payer: Self-pay | Admitting: Specialist

## 2019-04-17 NOTE — Telephone Encounter (Signed)
I called pharm and they are saying they did not rec'e it, I called it in to pharm.

## 2019-04-17 NOTE — Telephone Encounter (Signed)
Patient called and stated that needed a refill on dicofenac 50 mg.  Please call patient to advise.  [phone]

## 2019-04-17 NOTE — Telephone Encounter (Signed)
Patient left a voicemail message wanting to know if it would be okay for him to get a crown on his tooth on 06/07/2019.  He is having hip replacement surgery on May 09, 2019.  CB#[phone].  Thank you.

## 2019-04-17 NOTE — Telephone Encounter (Signed)
Patient called and wanted to know about some dental work he is having done. Having hip surgery 05/09/19.  Please call patient [phone]

## 2019-04-17 NOTE — Telephone Encounter (Signed)
Patient aware he can have dental cleanings  And a crown will be ok to have in Jan 2021

## 2019-04-18 ENCOUNTER — Other Ambulatory Visit (INDEPENDENT_AMBULATORY_CARE_PROVIDER_SITE_OTHER): Payer: Self-pay | Admitting: Specialist

## 2019-04-25 ENCOUNTER — Other Ambulatory Visit: Payer: Self-pay | Admitting: Physician Assistant

## 2019-05-01 ENCOUNTER — Telehealth: Payer: Self-pay | Admitting: Orthopaedic Surgery

## 2019-05-01 NOTE — Telephone Encounter (Signed)
Please advise on his Plavix Also, he is to quit diclofenac; correct?

## 2019-05-01 NOTE — Telephone Encounter (Signed)
Pt called in said he has surgery scheduled with dr.blackman 12/8 and needs to verify which medications he needs to stop taking prior to the surgery, please give him a call    [phone]

## 2019-05-01 NOTE — Telephone Encounter (Signed)
He needs to stop Plavix 7 days before surgery as well as stop his diclofenac 7 days before surgery.

## 2019-05-02 MED ORDER — ACETAMINOPHEN-CODEINE #3 300-30 MG PO TABS: 1.0000 | ORAL_TABLET | Freq: Three times a day (TID) | ORAL | 0 refills | Status: DC | PRN

## 2019-05-02 NOTE — Telephone Encounter (Signed)
Can you give him a call and give him this message for me

## 2019-05-02 NOTE — Telephone Encounter (Signed)
Called and advised patient.  Patient requested to have tylenol #3 sent in to pharmacy.

## 2019-05-04 ENCOUNTER — Other Ambulatory Visit: Payer: Self-pay

## 2019-05-04 NOTE — Pre-Procedure Instructions (Signed)
[id number] [address] [place] [address] Phone: [phone] Fax: [phone]      Your procedure is scheduled on  05-09-19  Report to [facility] Main Entrance "A" at 10A.M., and check in at the Admitting office.  Call this number if you have problems the morning of surgery:  [phone]  Call [phone] if you have any questions prior to your surgery date Monday-Friday 8am-4pm    Remember:  Do not eat or drink after midnight the night before your surgery  You may drink clear liquids until 9AMthe morning of your surgery.   Clear liquids allowed are: Water, Non-Citrus Juices (without pulp), Carbonated Beverages, Clear Tea, Black Coffee Only, and Gatorade   Take these medicines the morning of surgery with A SIP OF WATER : carvedilol (COREG)  7 days prior to surgery STOP taking any Aspirin (unless otherwise instructed by your surgeon), Aleve, Naproxen, Ibuprofen, Motrin, Advil, Goody's, BC's, all herbal medications, fish oil, and all vitamins.    The Morning of Surgery  Do not wear jewelry, make-up or nail polish.  Do not wear lotions, powders, or perfumes/colognes, or deodorant    Men may shave face and neck.  Do not bring valuables to the hospital.  [facility] is not responsible for any belongings or valuables.  If you are a smoker, DO NOT Smoke 24 hours prior to surgery  If you wear a CPAP at night please bring your mask, tubing, and machine the morning of surgery   Remember that you must have someone to transport you home after your surgery, and remain with you for 24 hours if you are discharged the same day.   Please bring cases for contacts, glasses, hearing aids, dentures or bridgework because it cannot be worn into surgery.    Leave your suitcase in the car.  After surgery it may be brought to your room.  For patients admitted to the hospital, discharge time will be determined by your treatment  team.  Patients discharged the day of surgery will not be allowed to drive home.    Special instructions:   [facility]- Preparing For Surgery  Before surgery, you can play an important role. Because skin is not sterile, your skin needs to be as free of germs as possible. You can reduce the number of germs on your skin by washing with CHG (chlorahexidine gluconate) Soap before surgery.  CHG is an antiseptic cleaner which kills germs and bonds with the skin to continue killing germs even after washing.    Oral Hygiene is also important to reduce your risk of infection.  Remember - BRUSH YOUR TEETH THE MORNING OF SURGERY WITH YOUR REGULAR TOOTHPASTE  Please do not use if you have an allergy to CHG or antibacterial soaps. If your skin becomes reddened/irritated stop using the CHG.  Do not shave (including legs and underarms) for at least 48 hours prior to first CHG shower. It is OK to shave your face.  Please follow these instructions carefully.   1. Shower the NIGHT BEFORE SURGERY and the MORNING OF SURGERY with CHG Soap.   2. If you chose to wash your hair, wash your hair first as usual with your normal shampoo.  3. After you shampoo, rinse your hair and body thoroughly to remove the shampoo.  4. Use CHG as you would any other liquid soap. You can apply CHG directly to the skin and wash gently with a scrungie or a clean  washcloth.   5. Apply the CHG Soap to your body ONLY FROM THE NECK DOWN.  Do not use on open wounds or open sores. Avoid contact with your eyes, ears, mouth and genitals (private parts). Wash Face and genitals (private parts)  with your normal soap.   6. Wash thoroughly, paying special attention to the area where your surgery will be performed.  7. Thoroughly rinse your body with warm water from the neck down.  8. DO NOT shower/wash with your normal soap after using and rinsing off the CHG Soap.  9. Pat yourself dry with a CLEAN TOWEL.  10. Wear CLEAN PAJAMAS to bed  the night before surgery, wear comfortable clothes the morning of surgery  11. Place CLEAN SHEETS on your bed the night of your first shower and DO NOT SLEEP WITH PETS.    Day of Surgery:  Please shower the morning of surgery with the CHG soap Do not apply any deodorants/lotions. Please wear clean clothes to the hospital/surgery center.   Remember to brush your teeth WITH YOUR REGULAR TOOTHPASTE.   Please read over the  fact sheets that you were given.

## 2019-05-05 ENCOUNTER — Encounter (HOSPITAL_COMMUNITY): Payer: Self-pay

## 2019-05-05 ENCOUNTER — Other Ambulatory Visit: Payer: Self-pay

## 2019-05-05 ENCOUNTER — Telehealth: Payer: Self-pay | Admitting: Orthopaedic Surgery

## 2019-05-05 ENCOUNTER — Encounter (HOSPITAL_COMMUNITY)
Admission: RE | Admit: 2019-05-05 | Discharge: 2019-05-05 | Disposition: A | Discharge status: Home / Self Care | Payer: BC Managed Care – PPO | Source: Ambulatory Visit | Attending: Orthopaedic Surgery | Admitting: Orthopaedic Surgery

## 2019-05-05 ENCOUNTER — Other Ambulatory Visit (HOSPITAL_COMMUNITY)
Admission: RE | Admit: 2019-05-05 | Discharge: 2019-05-05 | Disposition: A | Discharge status: Home / Self Care | Payer: BC Managed Care – PPO | Source: Ambulatory Visit | Attending: Orthopaedic Surgery | Admitting: Orthopaedic Surgery

## 2019-05-05 DIAGNOSIS — Z8674 Personal history of sudden cardiac arrest: Secondary | ICD-10-CM | POA: Insufficient documentation

## 2019-05-05 DIAGNOSIS — Z20828 Contact with and (suspected) exposure to other viral communicable diseases: Secondary | ICD-10-CM | POA: Diagnosis not present

## 2019-05-05 DIAGNOSIS — I10 Essential (primary) hypertension: Secondary | ICD-10-CM | POA: Insufficient documentation

## 2019-05-05 DIAGNOSIS — Z01818 Encounter for other preprocedural examination: Secondary | ICD-10-CM | POA: Insufficient documentation

## 2019-05-05 DIAGNOSIS — Z96641 Presence of right artificial hip joint: Secondary | ICD-10-CM | POA: Insufficient documentation

## 2019-05-05 DIAGNOSIS — M1612 Unilateral primary osteoarthritis, left hip: Secondary | ICD-10-CM | POA: Diagnosis not present

## 2019-05-05 DIAGNOSIS — I2510 Atherosclerotic heart disease of native coronary artery without angina pectoris: Secondary | ICD-10-CM | POA: Insufficient documentation

## 2019-05-05 DIAGNOSIS — R9431 Abnormal electrocardiogram [ECG] [EKG]: Secondary | ICD-10-CM | POA: Diagnosis not present

## 2019-05-05 DIAGNOSIS — I252 Old myocardial infarction: Secondary | ICD-10-CM | POA: Insufficient documentation

## 2019-05-05 LAB — BASIC METABOLIC PANEL
Anion gap: 8 (ref 5–15)
BUN: 14 mg/dL (ref 6–20)
CO2: 23 mmol/L (ref 22–32)
Calcium: 9 mg/dL (ref 8.9–10.3)
Chloride: 107 mmol/L (ref 98–111)
Creatinine, Ser: 0.98 mg/dL (ref 0.61–1.24)
GFR calc Af Amer: >60 mL/min (ref >60)
GFR calc non Af Amer: >60 mL/min (ref >60)
Glucose, Bld: 149 mg/dL — ABNORMAL HIGH (ref 70–99)
Potassium: 3.9 mmol/L (ref 3.5–5.1)
Sodium: 138 mmol/L (ref 135–145)

## 2019-05-05 LAB — SURGICAL PCR SCREEN
MRSA, PCR: NEGATIVE
Staphylococcus aureus: NEGATIVE

## 2019-05-05 LAB — CBC
HCT: 39.4 % (ref 39.0–52.0)
Hemoglobin: 13.7 g/dL (ref 13.0–17.0)
MCH: 31.6 pg (ref 26.0–34.0)
MCHC: 34.8 g/dL (ref 30.0–36.0)
MCV: 90.8 fL (ref 80.0–100.0)
Platelets: 163 10*3/uL (ref 150–400)
RBC: 4.34 MIL/uL (ref 4.22–5.81)
RDW: 12.5 % (ref 11.5–15.5)
WBC: 7 10*3/uL (ref 4.0–10.5)
nRBC: 0 % (ref 0.0–0.2)

## 2019-05-05 NOTE — Telephone Encounter (Signed)
Please advise 

## 2019-05-05 NOTE — Telephone Encounter (Signed)
He can go ahead and stop taking it starting tomorrow 12/5.

## 2019-05-05 NOTE — Pre-Procedure Instructions (Signed)
[id number] [address] [place] [address] Phone: [phone] Fax: [phone]      Your procedure is scheduled on  Tuesday, December, 8th, from 12:00 PM to 1:39 PM.  Report to [facility] Main Entrance "A" at 10:00A.M., and check in at the Admitting office.  Call this number if you have problems the morning of surgery:  [phone]  Call [phone] if you have any questions prior to your surgery date Monday-Friday 8am-4pm    Remember:  Do not eat after midnight the night before your surgery.  You may drink clear liquids until 9:00 Am the morning of your surgery.   Clear liquids allowed are: Water, Non-Citrus Juices (without pulp), Carbonated Beverages, Clear Tea, Black Coffee Only, and Gatorade.  Please complete your PRE-SURGERY ENSURE that was provided to you by 09:00 AM the morning of surgery.  Please, if able, drink it in one setting. DO NOT SIP.    Take these medicines the morning of surgery with A SIP OF WATER : carvedilol (COREG)  Follow your surgeon's instructions on when to stop Aspirin and clopidogrel (PLAVIX).  If no instructions were given by your surgeon then you will need to call the office to get those instructions.     As of today, STOP taking any Aspirin (unless otherwise instructed by your surgeon), Aleve, Naproxen, Ibuprofen, Motrin, Advil, Goody's, BC's, all herbal medications, fish oil, and all vitamins.    The Morning of Surgery  Do not wear jewelry.  Do not wear lotions, powders, colognes, or deodorant.  Men may shave face and neck.  Do not bring valuables to the hospital.  [facility] is not responsible for any belongings or valuables.  Remember that you must have someone to transport you home after your surgery, and remain with you for 24 hours if you are discharged the same day.   Please bring cases for contacts, glasses, hearing aids, dentures or bridgework because it cannot be worn into  surgery.    Leave your suitcase in the car.  After surgery it may be brought to your room.  For patients admitted to the hospital, discharge time will be determined by your treatment team.  Patients discharged the day of surgery will not be allowed to drive home.    Special instructions:   [facility]- Preparing For Surgery  Before surgery, you can play an important role. Because skin is not sterile, your skin needs to be as free of germs as possible. You can reduce the number of germs on your skin by washing with CHG (chlorahexidine gluconate) Soap before surgery.  CHG is an antiseptic cleaner which kills germs and bonds with the skin to continue killing germs even after washing.    Please do not use if you have an allergy to CHG or antibacterial soaps. If your skin becomes reddened/irritated stop using the CHG.  Do not shave (including legs and underarms) for at least 48 hours prior to first CHG shower. It is OK to shave your face.  Please follow these instructions carefully.   1. Shower the NIGHT BEFORE SURGERY and the MORNING OF SURGERY with CHG Soap.   2. If you chose to wash your hair, wash your hair first as usual with your normal shampoo.  3. After you shampoo, rinse your hair and body thoroughly to remove the shampoo.  4. Use CHG as you would any other liquid soap. You can apply CHG directly to the skin and wash gently  with a scrungie or a clean washcloth.   5. Apply the CHG Soap to your body ONLY FROM THE NECK DOWN.  Do not use on open wounds or open sores. Avoid contact with your eyes, ears, mouth and genitals (private parts). Wash Face and genitals (private parts)  with your normal soap.   6. Wash thoroughly, paying special attention to the area where your surgery will be performed.  7. Thoroughly rinse your body with warm water from the neck down.  8. DO NOT shower/wash with your normal soap after using and rinsing off the CHG Soap.  9. Pat yourself dry with a CLEAN  TOWEL.  10. Wear CLEAN PAJAMAS to bed the night before surgery, wear comfortable clothes the morning of surgery  11. Place CLEAN SHEETS on your bed the night of your first shower and DO NOT SLEEP WITH PETS.    Day of Surgery:  Remember to brush your teeth WITH YOUR REGULAR TOOTHPASTE. Please shower the morning of surgery with the CHG soap Do not apply any deodorants/lotions. Please wear clean clothes to the hospital/surgery center.      Please read over the  fact sheets that you were given.

## 2019-05-05 NOTE — Telephone Encounter (Signed)
Patient advised.

## 2019-05-05 NOTE — Progress Notes (Signed)
   05/05/19 0849  OBSTRUCTIVE SLEEP APNEA  Have you ever been diagnosed with sleep apnea through a sleep study? No  Do you snore loudly (loud enough to be heard through closed doors)?  0  Do you often feel tired, fatigued, or sleepy during the daytime (such as falling asleep during driving or talking to someone)? 0  Has anyone observed you stop breathing during your sleep? 0  Do you have, or are you being treated for high blood pressure? 1  BMI more than 35 kg/m2? 1  Age > 50 (1-yes) 1  Neck circumference greater than:Male 16 inches or larger, Male 17inches or larger? 1  Male Gender (Yes=1) 1  Obstructive Sleep Apnea Score 5

## 2019-05-05 NOTE — Telephone Encounter (Signed)
Patient called stating that he is having surgery on Tuesday, December 8th and wanted to know if he is to stop taking the 81mg  of Aspirin and if so, when.  CB#[phone].  Thank you.

## 2019-05-05 NOTE — Progress Notes (Signed)
PCP - Golden Pop, MD Cardiologist - Henrietta Dine, MD at [facility] -  Denies Chest x-ray - N/A EKG - 05/05/2019 Stress Test - 03/03/2012 ECHO - 03/03/2012 Cardiac Cath - 2013  Sleep Study - Denies  Patient denies being diabetic  Blood Thinner Instructions: Per Dr. Trevor Mace note, stop 7 days prior to sx. Per patient, last dose 05/02/2019. Aspirin Instructions: Patient states he will call Dr. Trevor Mace office today for instructions.  ERAS Protcol - Yes PRE-SURGERY Ensure or G2- Ensure  COVID TEST- 05/05/2019   Anesthesia review: Yes, cardiac hx  Patient denies shortness of breath, fever, cough and chest pain at PAT appointment  Coronavirus Screening  Have you experienced the following symptoms:  Cough yes/no: No Fever (>100.70F)  yes/no: No Runny nose yes/no: No Sore throat yes/no: No Difficulty breathing/shortness of breath  yes/no: No  Have you or a family member traveled in the last 14 days and where? yes/no: No   If the patient indicates "YES" to the above questions, their PAT will be rescheduled to limit the exposure to others and, the surgeon will be notified. THE PATIENT WILL NEED TO BE ASYMPTOMATIC FOR 14 DAYS.   If the patient is not experiencing any of these symptoms, the PAT nurse will instruct them to NOT bring anyone with them to their appointment since they may have these symptoms or traveled as well.   Please remind your patients and families that hospital visitation restrictions are in effect and the importance of the restrictions.    All instructions explained to the patient, with a verbal understanding of the material. Patient agrees to go over the instructions while at home for a better understanding. Patient also instructed to self quarantine after being tested for COVID-19. The opportunity to ask questions was provided.

## 2019-05-06 LAB — NOVEL CORONAVIRUS, NAA (HOSP ORDER, SEND-OUT TO REF LAB; TAT 18-24 HRS): SARS-CoV-2, NAA: NOT DETECTED

## 2019-05-08 MED ORDER — DEXTROSE 5 % IV SOLN
3.0000 g | INTRAVENOUS | Status: AC
  Administered 2019-05-09: 3 g via INTRAVENOUS
  Filled 2019-05-08: qty 3

## 2019-05-08 NOTE — Anesthesia Preprocedure Evaluation (Addendum)
Anesthesia Evaluation  Patient identified by MRN, date of birth, ID band Patient awake    Reviewed: Allergy & Precautions, H&P , NPO status , Patient's Chart, lab work & pertinent test results  Airway Mallampati: II   Neck ROM: full    Dental   Pulmonary former smoker,    breath sounds clear to auscultation       Cardiovascular hypertension, + CAD, + Past MI and + Cardiac Stents   Rhythm:regular Rate:Normal  CAD. H/o MI. S/p stent to LAD---> occluded, then re-stented.   Neuro/Psych    GI/Hepatic GERD  ,  Endo/Other    Renal/GU      Musculoskeletal  (+) Arthritis ,   Abdominal   Peds  Hematology   Anesthesia Other Findings   Reproductive/Obstetrics                            Anesthesia Physical Anesthesia Plan  ASA: III  Anesthesia Plan: MAC and Spinal   Post-op Pain Management:    Induction: Intravenous  PONV Risk Score and Plan: 1 and Ondansetron, Propofol infusion, Midazolam and Treatment may vary due to age or medical condition  Airway Management Planned: Simple Face Mask  Additional Equipment:   Intra-op Plan:   Post-operative Plan:   Informed Consent: I have reviewed the patients History and Physical, chart, labs and discussed the procedure including the risks, benefits and alternatives for the proposed anesthesia with the patient or authorized representative who has indicated his/her understanding and acceptance.       Plan Discussed with: CRNA, Anesthesiologist and Surgeon  Anesthesia Plan Comments: ([facility] at [facility] for history of CAD. Anterior MI in 7672 complicated by cardiac arrest. Found to have total occlusion of LAD which was stented. He recovered and did well. 2 years later he was found to have restenosis and LAD was re stented. He was last seen by Dr. Aline Brochure 03/16/19. Per note, "Currently he is asymptomatic from a cardiovascular standpoint. He  has had no angina. 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 He had right total hip replacement done about a year ago. He did well from a cardiovascular standpoint with this and had improvement in right leg pain and function. He now has similar problem in the left and plans in December to have left total hip replacement. At this point I made no changes in his medications. He is clinically stable and his medications are good. In terms of the upcoming hip replacement I have advised him to stop the Plavix 7 days prior to hip replacement and to restart the Plavix once his orthopedic surgeon thinks the bleeding risk from the hip surgery has resolved. He will return for cardiac reevaluation in a year or sooner as needed."  BP uncontrolled at PAT appointment 179/117. Pt reported he was in a lot of pain after walking from the parking deck, using a cane due to hip OA. Pt contacted on 12/7 to follow up, he rechecked his BP on home machine and reported 163/85. He will continue to monitor. He understands that significantly uncontrolled BP on DOS could be cause for cancellation.   Preop labs reviewed, WNL.  EKG 05/05/19: Normal sinus rhythm. Rate 75. Anterolateral infarct , age undetermined. No significant change since last tracing.  Cath and PCI 03/23/12 (care everywhere): Coronary arteries:  Left main: Normal  Left anterior descending: 10% proximal, 80% mid in stent  D2: (large)  diffuse 30% mid  Left circumflex: 10% proximal  Ramus (large) 30% proximal  Right coronary: (dominant) 30-40% proximal  Impressions and Recommendations:  1. Single-vessel obstructive coronary artery disease with evidence of restenosis within the vision stents in the mid LAD  (note: Angiographically of the ramus, 2nd diagonal branch and the proximal right coronary artery look less severe in terms of angiographic stenosis than they did on the 2009 catheterization. Given the echo report of inferior  ischemia we will proceed with FFR evaluation of the right coronary artery and plan repeat PCI of the LAD.)  Using bivalirudin and 600 mg of p.o. Plavix; FFR of the right coronary artery performed after intracoronary nitroglycerin and during intravenous adenosine infusion. 0.98 at baseline and a minimum of 0.90 during adenosine infusion. I therefore no PCI of the right coronary artery done.  PCI  Percutaneous coronary intervention performed of the 80% mid lesion within prior "bare metal" Vision stents..  Guide catheter: XB3.5  Devices: BMW gw, 2.25/15 Quantum, 2.25/28 Xience DES deployed 14 atmospheres  Results: Good angiographic result  Impressions and Recommendations:  1-vessel coronary artery disease  Successful PCI  Aspirin 81 mg daily indefinitely  Clopidogrel (Plavix) 75 mg PO daily for at least 12 months  Stress echo 03/03/12 (care eerywhere): Interpretation: ABNORMAL STRESS TEST. ABNORMAL RESTING STUDY, WORSE WITH  STRESS.  Note: SMALL AREA OF DISTAL INFERIOR SEPTUM AND INFERIOR WALL, WORSE WITH  STRESS  TRIVIAL PR  Maximum workload of 13.70 METs was achieved during exercise.)       Anesthesia Quick Evaluation

## 2019-05-08 NOTE — Progress Notes (Signed)
Anesthesia Chart Review:  Follows with cardiology at [facility] for history of CAD. Anterior MI in 4196 complicated by cardiac arrest. Found to have total occlusion of LAD which was stented. He recovered and did well. 2 years later he was found to have restenosis and LAD was re stented. He was last seen by Dr. Aline Brochure 03/16/19. Per note "Currently he is asymptomatic from a cardiovascular standpoint. He has had no angina. 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 He had right total hip replacement done about a year ago. He did well from a cardiovascular standpoint with this and had improvement in right leg pain and function. He now has similar problem in the left and plans in December to have left total hip replacement. At this point I made no changes in his medications. He is clinically stable and his medications are good. In terms of the upcoming hip replacement I have advised him to stop the Plavix 7 days prior to hip replacement and to restart the Plavix once his orthopedic surgeon thinks the bleeding risk from the hip surgery has resolved. He will return for cardiac reevaluation in a year or sooner as needed."  BP uncontrolled at PAT appointment 179/117. Pt reported he was in a lot of pain after walking from the parking deck, using a cane due to hip OA.  I called the pt on 12/7 to followup, he rechecked his BP on home machine and reported 163/85. He will continue to monitor. He understands that significantly uncontrolled BP on DOS could be cause for cancellation.   Preop labs reviewed, WNL.  EKG 05/05/19: Normal sinus rhythm. Rate 75. Anterolateral infarct , age undetermined. No significant change since last tracing.  Cath and PCI 03/23/12 (care everywhere): Coronary arteries:  Left main: Normal  Left anterior descending: 10% proximal, 80% mid in stent  D2: (large) diffuse 30% mid  Left circumflex: 10% proximal  Ramus (large) 30% proximal  Right coronary:  (dominant) 30-40% proximal  Impressions and Recommendations:  1. Single-vessel obstructive coronary artery disease with evidence of restenosis within the vision stents in the mid LAD  (note: Angiographically of the ramus, 2nd diagonal branch and the proximal right coronary artery look less severe in terms of angiographic stenosis than they did on the 2009 catheterization. Given the echo report of inferior ischemia we will proceed with FFR evaluation of the right coronary artery and plan repeat PCI of the LAD.)  Using bivalirudin and 600 mg of p.o. Plavix; FFR of the right coronary artery performed after intracoronary nitroglycerin and during intravenous adenosine infusion. 0.98 at baseline and a minimum of 0.90 during adenosine infusion. I therefore no PCI of the right coronary artery done.  PCI  Percutaneous coronary intervention performed of the 80% mid lesion within prior "bare metal" Vision stents..  Guide catheter: XB3.5  Devices: BMW gw, 2.25/15 Quantum, 2.25/28 Xience DES deployed 14 atmospheres  Results: Good angiographic result  Impressions and Recommendations:  1-vessel coronary artery disease  Successful PCI  Aspirin 81 mg daily indefinitely  Clopidogrel (Plavix) 75 mg PO daily for at least 12 months   Stress echo 03/03/12 (care eerywhere): Interpretation: ABNORMAL STRESS TEST. ABNORMAL RESTING STUDY, WORSE WITH  STRESS.  Note: SMALL AREA OF DISTAL INFERIOR SEPTUM AND INFERIOR WALL, WORSE WITH  STRESS  TRIVIAL PR  Maximum workload of 13.70 METs was achieved during exercise.   Wynonia Musty [facility]/Anesthesiology Phone [phone] 05/08/2019 9:31 AM

## 2019-05-09 ENCOUNTER — Encounter (HOSPITAL_COMMUNITY): Payer: Self-pay

## 2019-05-09 ENCOUNTER — Observation Stay (HOSPITAL_COMMUNITY)
Admission: RE | Admit: 2019-05-09 | Discharge: 2019-05-10 | Disposition: A | Discharge status: Home Health | Payer: BC Managed Care – PPO | Attending: Orthopaedic Surgery | Admitting: Orthopaedic Surgery

## 2019-05-09 ENCOUNTER — Inpatient Hospital Stay (HOSPITAL_COMMUNITY): Payer: BC Managed Care – PPO

## 2019-05-09 ENCOUNTER — Inpatient Hospital Stay (HOSPITAL_COMMUNITY): Payer: BC Managed Care – PPO | Admitting: Physician Assistant

## 2019-05-09 ENCOUNTER — Inpatient Hospital Stay (HOSPITAL_COMMUNITY): Payer: BC Managed Care – PPO | Admitting: Certified Registered Nurse Anesthetist

## 2019-05-09 ENCOUNTER — Other Ambulatory Visit: Payer: Self-pay

## 2019-05-09 ENCOUNTER — Encounter (HOSPITAL_COMMUNITY)
Admission: RE | Disposition: A | Discharge status: Home Health | Payer: Self-pay | Source: Home / Self Care | Attending: Orthopaedic Surgery

## 2019-05-09 DIAGNOSIS — Z96641 Presence of right artificial hip joint: Secondary | ICD-10-CM | POA: Insufficient documentation

## 2019-05-09 DIAGNOSIS — Z8249 Family history of ischemic heart disease and other diseases of the circulatory system: Secondary | ICD-10-CM | POA: Diagnosis not present

## 2019-05-09 DIAGNOSIS — I252 Old myocardial infarction: Secondary | ICD-10-CM | POA: Insufficient documentation

## 2019-05-09 DIAGNOSIS — I2510 Atherosclerotic heart disease of native coronary artery without angina pectoris: Secondary | ICD-10-CM | POA: Diagnosis not present

## 2019-05-09 DIAGNOSIS — Z6837 Body mass index (BMI) 37.0-37.9, adult: Secondary | ICD-10-CM | POA: Insufficient documentation

## 2019-05-09 DIAGNOSIS — Z981 Arthrodesis status: Secondary | ICD-10-CM | POA: Diagnosis not present

## 2019-05-09 DIAGNOSIS — Z791 Long term (current) use of non-steroidal anti-inflammatories (NSAID): Secondary | ICD-10-CM | POA: Diagnosis not present

## 2019-05-09 DIAGNOSIS — E785 Hyperlipidemia, unspecified: Secondary | ICD-10-CM | POA: Diagnosis present

## 2019-05-09 DIAGNOSIS — K219 Gastro-esophageal reflux disease without esophagitis: Secondary | ICD-10-CM | POA: Diagnosis present

## 2019-05-09 DIAGNOSIS — Z87891 Personal history of nicotine dependence: Secondary | ICD-10-CM | POA: Diagnosis not present

## 2019-05-09 DIAGNOSIS — I10 Essential (primary) hypertension: Secondary | ICD-10-CM | POA: Diagnosis not present

## 2019-05-09 DIAGNOSIS — Z20828 Contact with and (suspected) exposure to other viral communicable diseases: Secondary | ICD-10-CM | POA: Diagnosis present

## 2019-05-09 DIAGNOSIS — R0601 Orthopnea: Secondary | ICD-10-CM | POA: Diagnosis not present

## 2019-05-09 DIAGNOSIS — M549 Dorsalgia, unspecified: Secondary | ICD-10-CM | POA: Diagnosis present

## 2019-05-09 DIAGNOSIS — E669 Obesity, unspecified: Secondary | ICD-10-CM | POA: Diagnosis not present

## 2019-05-09 DIAGNOSIS — Z419 Encounter for procedure for purposes other than remedying health state, unspecified: Secondary | ICD-10-CM

## 2019-05-09 DIAGNOSIS — M4712 Other spondylosis with myelopathy, cervical region: Secondary | ICD-10-CM | POA: Insufficient documentation

## 2019-05-09 DIAGNOSIS — M160 Bilateral primary osteoarthritis of hip: Secondary | ICD-10-CM | POA: Diagnosis present

## 2019-05-09 DIAGNOSIS — Z96642 Presence of left artificial hip joint: Secondary | ICD-10-CM

## 2019-05-09 DIAGNOSIS — Z79899 Other long term (current) drug therapy: Secondary | ICD-10-CM | POA: Diagnosis not present

## 2019-05-09 DIAGNOSIS — R7302 Impaired glucose tolerance (oral): Secondary | ICD-10-CM | POA: Diagnosis not present

## 2019-05-09 DIAGNOSIS — M1612 Unilateral primary osteoarthritis, left hip: Principal | ICD-10-CM

## 2019-05-09 HISTORY — PX: TOTAL HIP ARTHROPLASTY: SHX124

## 2019-05-09 SURGERY — ARTHROPLASTY, HIP, TOTAL, ANTERIOR APPROACH
Anesthesia: General | Site: Hip | Laterality: Left

## 2019-05-09 MED ORDER — SODIUM CHLORIDE 0.9 % IV SOLN
INTRAVENOUS | Status: DC
  Administered 2019-05-09: 16:00:00 via INTRAVENOUS

## 2019-05-09 MED ORDER — GABAPENTIN 100 MG PO CAPS
100.0000 mg | ORAL_CAPSULE | Freq: Three times a day (TID) | ORAL | Status: DC
  Administered 2019-05-09 – 2019-05-10 (×3): 100 mg via ORAL
  Filled 2019-05-09 (×3): qty 1

## 2019-05-09 MED ORDER — OXYCODONE HCL 5 MG/5ML PO SOLN: 5.0000 mg | Freq: Once | ORAL | Status: AC | PRN

## 2019-05-09 MED ORDER — SUGAMMADEX SODIUM 200 MG/2ML IV SOLN
INTRAVENOUS | Status: DC | PRN
  Administered 2019-05-09: 200 mg via INTRAVENOUS

## 2019-05-09 MED ORDER — DIPHENHYDRAMINE HCL 12.5 MG/5ML PO ELIX: 12.5000 mg | ORAL_SOLUTION | ORAL | Status: DC | PRN

## 2019-05-09 MED ORDER — TRANEXAMIC ACID-NACL 1000-0.7 MG/100ML-% IV SOLN
INTRAVENOUS | Status: AC
  Filled 2019-05-09: qty 100

## 2019-05-09 MED ORDER — TRANEXAMIC ACID-NACL 1000-0.7 MG/100ML-% IV SOLN
1000.0000 mg | INTRAVENOUS | Status: AC
  Administered 2019-05-09: 1000 mg via INTRAVENOUS

## 2019-05-09 MED ORDER — HYDROMORPHONE HCL 1 MG/ML IJ SOLN
INTRAMUSCULAR | Status: AC
  Filled 2019-05-09: qty 0.5

## 2019-05-09 MED ORDER — PHENYLEPHRINE HCL-NACL 10-0.9 MG/250ML-% IV SOLN
INTRAVENOUS | Status: DC | PRN
  Administered 2019-05-09: 20 ug/min via INTRAVENOUS

## 2019-05-09 MED ORDER — OXYCODONE HCL 5 MG PO TABS
ORAL_TABLET | ORAL | Status: AC
  Filled 2019-05-09: qty 1

## 2019-05-09 MED ORDER — ALUM & MAG HYDROXIDE-SIMETH 200-200-20 MG/5ML PO SUSP: 30.0000 mL | ORAL | Status: DC | PRN

## 2019-05-09 MED ORDER — POVIDONE-IODINE 10 % EX SWAB
2.0000 "application " | Freq: Once | CUTANEOUS | Status: AC
  Administered 2019-05-09: 2 via TOPICAL

## 2019-05-09 MED ORDER — DOCUSATE SODIUM 100 MG PO CAPS
100.0000 mg | ORAL_CAPSULE | Freq: Two times a day (BID) | ORAL | Status: DC
  Administered 2019-05-09 – 2019-05-10 (×2): 100 mg via ORAL
  Filled 2019-05-09 (×2): qty 1

## 2019-05-09 MED ORDER — LACTATED RINGERS IV SOLN
INTRAVENOUS | Status: DC
  Administered 2019-05-09: 11:00:00 via INTRAVENOUS

## 2019-05-09 MED ORDER — METHOCARBAMOL 500 MG PO TABS
500.0000 mg | ORAL_TABLET | Freq: Four times a day (QID) | ORAL | Status: DC | PRN
  Administered 2019-05-10: 12:00:00 500 mg via ORAL
  Filled 2019-05-09: qty 1

## 2019-05-09 MED ORDER — FENTANYL CITRATE (PF) 250 MCG/5ML IJ SOLN
INTRAMUSCULAR | Status: DC | PRN
  Administered 2019-05-09 (×3): 50 ug via INTRAVENOUS

## 2019-05-09 MED ORDER — ONDANSETRON HCL 4 MG PO TABS: 4.0000 mg | ORAL_TABLET | Freq: Four times a day (QID) | ORAL | Status: DC | PRN

## 2019-05-09 MED ORDER — 0.9 % SODIUM CHLORIDE (POUR BTL) OPTIME
TOPICAL | Status: DC | PRN
  Administered 2019-05-09: 1000 mL

## 2019-05-09 MED ORDER — STERILE WATER FOR IRRIGATION IR SOLN
Status: DC | PRN
  Administered 2019-05-09: 1000 mL

## 2019-05-09 MED ORDER — SUCCINYLCHOLINE CHLORIDE 200 MG/10ML IV SOSY
PREFILLED_SYRINGE | INTRAVENOUS | Status: DC | PRN
  Administered 2019-05-09: 130 mg via INTRAVENOUS

## 2019-05-09 MED ORDER — ACETAMINOPHEN 325 MG PO TABS: 325.0000 mg | ORAL_TABLET | Freq: Four times a day (QID) | ORAL | Status: DC | PRN

## 2019-05-09 MED ORDER — DIAZEPAM 5 MG PO TABS: 5.0000 mg | ORAL_TABLET | Freq: Every day | ORAL | Status: DC | PRN

## 2019-05-09 MED ORDER — METOCLOPRAMIDE HCL 5 MG/ML IJ SOLN: 5.0000 mg | Freq: Three times a day (TID) | INTRAMUSCULAR | Status: DC | PRN

## 2019-05-09 MED ORDER — ONDANSETRON HCL 4 MG/2ML IJ SOLN: 4.0000 mg | Freq: Four times a day (QID) | INTRAMUSCULAR | Status: DC | PRN

## 2019-05-09 MED ORDER — CARVEDILOL 25 MG PO TABS
25.0000 mg | ORAL_TABLET | Freq: Two times a day (BID) | ORAL | Status: DC
  Administered 2019-05-09 – 2019-05-10 (×2): 25 mg via ORAL
  Filled 2019-05-09 (×2): qty 1

## 2019-05-09 MED ORDER — LIDOCAINE 2% (20 MG/ML) 5 ML SYRINGE
INTRAMUSCULAR | Status: DC | PRN
  Administered 2019-05-09: 80 mg via INTRAVENOUS

## 2019-05-09 MED ORDER — HYDROMORPHONE HCL 1 MG/ML IJ SOLN: 0.5000 mg | INTRAMUSCULAR | Status: DC | PRN

## 2019-05-09 MED ORDER — OXYCODONE HCL 5 MG PO TABS
10.0000 mg | ORAL_TABLET | ORAL | Status: DC | PRN
  Administered 2019-05-09: 10 mg via ORAL
  Filled 2019-05-09: qty 2

## 2019-05-09 MED ORDER — SODIUM CHLORIDE 0.9 % IR SOLN
Status: DC | PRN
  Administered 2019-05-09: 3000 mL

## 2019-05-09 MED ORDER — PHENOL 1.4 % MT LIQD: 1.0000 | OROMUCOSAL | Status: DC | PRN

## 2019-05-09 MED ORDER — MIDAZOLAM HCL 5 MG/5ML IJ SOLN
INTRAMUSCULAR | Status: DC | PRN
  Administered 2019-05-09: 2 mg via INTRAVENOUS

## 2019-05-09 MED ORDER — OXYCODONE HCL 5 MG PO TABS: 5.0000 mg | ORAL_TABLET | ORAL | Status: DC | PRN

## 2019-05-09 MED ORDER — CLOPIDOGREL BISULFATE 75 MG PO TABS
75.0000 mg | ORAL_TABLET | Freq: Every day | ORAL | Status: DC
  Administered 2019-05-10: 75 mg via ORAL
  Filled 2019-05-09: qty 1

## 2019-05-09 MED ORDER — ASPIRIN EC 81 MG PO TBEC
81.0000 mg | DELAYED_RELEASE_TABLET | Freq: Every day | ORAL | Status: DC
  Administered 2019-05-09 – 2019-05-10 (×2): 81 mg via ORAL
  Filled 2019-05-09 (×2): qty 1

## 2019-05-09 MED ORDER — FENTANYL CITRATE (PF) 250 MCG/5ML IJ SOLN
INTRAMUSCULAR | Status: AC
  Filled 2019-05-09: qty 5

## 2019-05-09 MED ORDER — ROCURONIUM BROMIDE 10 MG/ML (PF) SYRINGE
PREFILLED_SYRINGE | INTRAVENOUS | Status: DC | PRN
  Administered 2019-05-09: 60 mg via INTRAVENOUS

## 2019-05-09 MED ORDER — CHLORHEXIDINE GLUCONATE 4 % EX LIQD: 60.0000 mL | Freq: Once | CUTANEOUS | Status: DC

## 2019-05-09 MED ORDER — POLYETHYLENE GLYCOL 3350 17 G PO PACK: 17.0000 g | PACK | Freq: Every day | ORAL | Status: DC | PRN

## 2019-05-09 MED ORDER — MENTHOL 3 MG MT LOZG: 1.0000 | LOZENGE | OROMUCOSAL | Status: DC | PRN

## 2019-05-09 MED ORDER — CEFAZOLIN SODIUM-DEXTROSE 1-4 GM/50ML-% IV SOLN
1.0000 g | Freq: Four times a day (QID) | INTRAVENOUS | Status: AC
  Administered 2019-05-09 – 2019-05-10 (×2): 1 g via INTRAVENOUS
  Filled 2019-05-09 (×2): qty 50

## 2019-05-09 MED ORDER — FENTANYL CITRATE (PF) 100 MCG/2ML IJ SOLN
25.0000 ug | INTRAMUSCULAR | Status: DC | PRN
  Administered 2019-05-09 (×3): 50 ug via INTRAVENOUS

## 2019-05-09 MED ORDER — METOCLOPRAMIDE HCL 5 MG PO TABS: 5.0000 mg | ORAL_TABLET | Freq: Three times a day (TID) | ORAL | Status: DC | PRN

## 2019-05-09 MED ORDER — ONDANSETRON HCL 4 MG/2ML IJ SOLN
INTRAMUSCULAR | Status: DC | PRN
  Administered 2019-05-09: 4 mg via INTRAVENOUS

## 2019-05-09 MED ORDER — FENTANYL CITRATE (PF) 100 MCG/2ML IJ SOLN
INTRAMUSCULAR | Status: AC
  Filled 2019-05-09: qty 2

## 2019-05-09 MED ORDER — INFLUENZA VAC SPLIT QUAD 0.5 ML IM SUSY: 0.5000 mL | PREFILLED_SYRINGE | INTRAMUSCULAR | Status: DC

## 2019-05-09 MED ORDER — OXYCODONE HCL 5 MG PO TABS
5.0000 mg | ORAL_TABLET | Freq: Once | ORAL | Status: AC | PRN
  Administered 2019-05-09: 5 mg via ORAL

## 2019-05-09 MED ORDER — MIDAZOLAM HCL 2 MG/2ML IJ SOLN
INTRAMUSCULAR | Status: AC
  Filled 2019-05-09: qty 2

## 2019-05-09 MED ORDER — PANTOPRAZOLE SODIUM 40 MG PO TBEC
40.0000 mg | DELAYED_RELEASE_TABLET | Freq: Every day | ORAL | Status: DC
  Administered 2019-05-09 – 2019-05-10 (×2): 40 mg via ORAL
  Filled 2019-05-09 (×2): qty 1

## 2019-05-09 MED ORDER — ATORVASTATIN CALCIUM 10 MG PO TABS
20.0000 mg | ORAL_TABLET | Freq: Every evening | ORAL | Status: DC
  Administered 2019-05-09: 20 mg via ORAL
  Filled 2019-05-09: qty 2

## 2019-05-09 MED ORDER — LISINOPRIL 20 MG PO TABS
40.0000 mg | ORAL_TABLET | Freq: Every day | ORAL | Status: DC
  Administered 2019-05-10: 40 mg via ORAL
  Filled 2019-05-09: qty 2

## 2019-05-09 MED ORDER — PROPOFOL 500 MG/50ML IV EMUL
INTRAVENOUS | Status: DC | PRN
  Administered 2019-05-09: 25 ug/kg/min via INTRAVENOUS

## 2019-05-09 MED ORDER — VITAMIN D 25 MCG (1000 UNIT) PO TABS
2000.0000 [IU] | ORAL_TABLET | Freq: Every day | ORAL | Status: DC
  Administered 2019-05-10: 2000 [IU] via ORAL
  Filled 2019-05-09: qty 2

## 2019-05-09 MED ORDER — DEXAMETHASONE SODIUM PHOSPHATE 10 MG/ML IJ SOLN
INTRAMUSCULAR | Status: DC | PRN
  Administered 2019-05-09: 4 mg via INTRAVENOUS

## 2019-05-09 MED ORDER — METHOCARBAMOL 1000 MG/10ML IJ SOLN
500.0000 mg | Freq: Four times a day (QID) | INTRAVENOUS | Status: DC | PRN
  Filled 2019-05-09: qty 5

## 2019-05-09 MED ORDER — FENTANYL CITRATE (PF) 100 MCG/2ML IJ SOLN
INTRAMUSCULAR | Status: AC
  Administered 2019-05-09: 50 ug via INTRAVENOUS
  Filled 2019-05-09: qty 2

## 2019-05-09 MED ORDER — PROPOFOL 10 MG/ML IV BOLUS
INTRAVENOUS | Status: DC | PRN
  Administered 2019-05-09: 20 mg via INTRAVENOUS
  Administered 2019-05-09: 200 mg via INTRAVENOUS
  Administered 2019-05-09: 20 mg via INTRAVENOUS
  Administered 2019-05-09: 10 mg via INTRAVENOUS

## 2019-05-09 MED ORDER — KETOROLAC TROMETHAMINE 15 MG/ML IJ SOLN
7.5000 mg | Freq: Four times a day (QID) | INTRAMUSCULAR | Status: AC
  Administered 2019-05-09 – 2019-05-10 (×3): 7.5 mg via INTRAVENOUS
  Filled 2019-05-09 (×3): qty 1

## 2019-05-09 MED ORDER — HYDROMORPHONE HCL 1 MG/ML IJ SOLN
INTRAMUSCULAR | Status: DC | PRN
  Administered 2019-05-09: 0.5 mg via INTRAVENOUS

## 2019-05-09 MED ORDER — ONDANSETRON HCL 4 MG/2ML IJ SOLN
INTRAMUSCULAR | Status: AC
  Filled 2019-05-09: qty 2

## 2019-05-09 SURGICAL SUPPLY — 57 items
BENZOIN TINCTURE PRP APPL 2/3 (GAUZE/BANDAGES/DRESSINGS) ×3 IMPLANT
BLADE CLIPPER SURG (BLADE) IMPLANT
BLADE SAW SGTL 18X1.27X75 (BLADE) ×2 IMPLANT
BLADE SAW SGTL 18X1.27X75MM (BLADE) ×1
CLOSURE STERI-STRIP 1/2X4 (GAUZE/BANDAGES/DRESSINGS) ×1
CLOSURE WOUND 1/2 X4 (GAUZE/BANDAGES/DRESSINGS) ×2
CLSR STERI-STRIP ANTIMIC 1/2X4 (GAUZE/BANDAGES/DRESSINGS) ×2 IMPLANT
COVER SURGICAL LIGHT HANDLE (MISCELLANEOUS) ×3 IMPLANT
COVER WAND RF STERILE (DRAPES) ×3 IMPLANT
CUP SECTOR GRIPTON 58MM (Orthopedic Implant) ×3 IMPLANT
DRAPE C-ARM 42X72 X-RAY (DRAPES) ×3 IMPLANT
DRAPE STERI IOBAN 125X83 (DRAPES) ×3 IMPLANT
DRAPE U-SHAPE 47X51 STRL (DRAPES) ×9 IMPLANT
DRSG AQUACEL AG ADV 3.5X10 (GAUZE/BANDAGES/DRESSINGS) ×3 IMPLANT
DRSG XEROFORM 1X8 (GAUZE/BANDAGES/DRESSINGS) ×3 IMPLANT
DURAPREP 26ML APPLICATOR (WOUND CARE) ×3 IMPLANT
ELECT BLADE 4.0 EZ CLEAN MEGAD (MISCELLANEOUS) ×3
ELECT BLADE 6.5 EXT (BLADE) IMPLANT
ELECT REM PT RETURN 9FT ADLT (ELECTROSURGICAL) ×3
ELECTRODE BLDE 4.0 EZ CLN MEGD (MISCELLANEOUS) ×1 IMPLANT
ELECTRODE REM PT RTRN 9FT ADLT (ELECTROSURGICAL) ×1 IMPLANT
FACESHIELD WRAPAROUND (MASK) ×6 IMPLANT
GLOVE BIOGEL PI IND STRL 8 (GLOVE) ×2 IMPLANT
GLOVE BIOGEL PI INDICATOR 8 (GLOVE) ×4
GLOVE ECLIPSE 8.0 STRL XLNG CF (GLOVE) ×3 IMPLANT
GLOVE ORTHO TXT STRL SZ7.5 (GLOVE) ×6 IMPLANT
GOWN STRL REUS W/ TWL LRG LVL3 (GOWN DISPOSABLE) ×2 IMPLANT
GOWN STRL REUS W/ TWL XL LVL3 (GOWN DISPOSABLE) ×2 IMPLANT
GOWN STRL REUS W/TWL LRG LVL3 (GOWN DISPOSABLE) ×4
GOWN STRL REUS W/TWL XL LVL3 (GOWN DISPOSABLE) ×4
HANDPIECE INTERPULSE COAX TIP (DISPOSABLE) ×2
HEAD CERAMIC DELTA 36 PLUS 1.5 (Hips) ×3 IMPLANT
KIT BASIN OR (CUSTOM PROCEDURE TRAY) ×3 IMPLANT
KIT TURNOVER KIT B (KITS) ×3 IMPLANT
LINER NEUTRAL 52X36X58N (Liner) ×3 IMPLANT
MANIFOLD NEPTUNE II (INSTRUMENTS) ×3 IMPLANT
NS IRRIG 1000ML POUR BTL (IV SOLUTION) ×3 IMPLANT
PACK TOTAL JOINT (CUSTOM PROCEDURE TRAY) ×3 IMPLANT
PAD ARMBOARD 7.5X6 YLW CONV (MISCELLANEOUS) ×3 IMPLANT
SET HNDPC FAN SPRY TIP SCT (DISPOSABLE) ×1 IMPLANT
STAPLER VISISTAT 35W (STAPLE) ×6 IMPLANT
STEM CORAIL (Stem) ×3 IMPLANT
STRIP CLOSURE SKIN 1/2X4 (GAUZE/BANDAGES/DRESSINGS) ×4 IMPLANT
SUT ETHIBOND NAB CT1 #1 30IN (SUTURE) ×3 IMPLANT
SUT MNCRL AB 4-0 PS2 18 (SUTURE) IMPLANT
SUT VIC AB 0 CT1 27 (SUTURE) ×2
SUT VIC AB 0 CT1 27XBRD ANBCTR (SUTURE) ×1 IMPLANT
SUT VIC AB 1 CT1 27 (SUTURE) ×2
SUT VIC AB 1 CT1 27XBRD ANBCTR (SUTURE) ×1 IMPLANT
SUT VIC AB 2-0 CT1 27 (SUTURE) ×2
SUT VIC AB 2-0 CT1 TAPERPNT 27 (SUTURE) ×1 IMPLANT
TOWEL GREEN STERILE (TOWEL DISPOSABLE) ×3 IMPLANT
TOWEL GREEN STERILE FF (TOWEL DISPOSABLE) ×3 IMPLANT
TRAY CATH 16FR W/PLASTIC CATH (SET/KITS/TRAYS/PACK) IMPLANT
TRAY FOLEY W/BAG SLVR 16FR (SET/KITS/TRAYS/PACK)
TRAY FOLEY W/BAG SLVR 16FR ST (SET/KITS/TRAYS/PACK) IMPLANT
WATER STERILE IRR 1000ML POUR (IV SOLUTION) ×6 IMPLANT

## 2019-05-09 NOTE — Plan of Care (Signed)
  Problem: Education: Goal: Knowledge of General Education information will improve Description: Including pain rating scale, medication(s)/side effects and non-pharmacologic comfort measures Outcome: Progressing   Problem: Clinical Measurements: Goal: Respiratory complications will improve Outcome: Progressing Note: On room air   Problem: Activity: Goal: Risk for activity intolerance will decrease Outcome: Progressing Note: Up to chair with PT feels a lot better   Problem: Nutrition: Goal: Adequate nutrition will be maintained Outcome: Progressing   Problem: Coping: Goal: Level of anxiety will decrease Outcome: Progressing   Problem: Elimination: Goal: Will not experience complications related to urinary retention Outcome: Progressing   Problem: Pain Managment: Goal: General experience of comfort will improve Outcome: Progressing

## 2019-05-09 NOTE — Brief Op Note (Signed)
05/09/2019  1:38 PM  PATIENT:  Eddie Alexander  58 y.o. male  PRE-OPERATIVE DIAGNOSIS:  osteoarthritis left hip  POST-OPERATIVE DIAGNOSIS:  osteoarthritis left hip  PROCEDURE:  Procedure(s): LEFT TOTAL HIP ARTHROPLASTY ANTERIOR APPROACH (Left)  SURGEON:  Surgeon(s) and Role:    Mcarthur Rossetti, MD - Primary  PHYSICIAN ASSISTANT: Benita Stabile, PA-C  ANESTHESIA:   spinal and general  EBL:  350 mL   COUNTS:  YES  DICTATION: .Other Dictation: Dictation Number [id number]  PLAN OF CARE: Admit to inpatient   PATIENT DISPOSITION:  PACU - hemodynamically stable.   Delay start of Pharmacological VTE agent (>24hrs) due to surgical blood loss or risk of bleeding: no

## 2019-05-09 NOTE — Op Note (Signed)
NAME: Eddie Alexander, Eddie Alexander MEDICAL RECORD QQ:[id number] ACCOUNT [id number] DATE OF BIRTH:[birth year] FACILITY: [facility] LOCATION: [facility] PHYSICIAN:Elianne Gubser Aretha Parrot, MD  OPERATIVE REPORT  DATE OF PROCEDURE:  05/09/2019  PREOPERATIVE DIAGNOSIS:  Primary osteoarthritis and degenerative joint disease, left hip.  POSTOPERATIVE DIAGNOSIS:  Primary osteoarthritis and degenerative joint disease, left hip.  PROCEDURE:  Left total hip arthroplasty through direct anterior approach.  IMPLANTS:  DePuy Sector Gription acetabular component size 58, size 36+0 neutral polyethylene liner, size 13 Corail femoral component with varus offset, size 36+1.5 ceramic hip ball.  SURGEON:  Louellen Molder, MD  ASSISTANT:  Richardean Canal, PA-C  ANESTHESIA: 1.  Attempted spinal. 2.  General.  ANTIBIOTICS:  3 g IV Ancef.  ESTIMATED BLOOD LOSS:  350 mL.  COMPLICATIONS:  None.  INDICATIONS:  The patient is a 58 year old gentleman, well known to me.  He has debilitating arthritis involving both his hips.  In 05/2018, he underwent a successful right total hip arthroplasty.  His left hip does show severe arthritis and his pain has  become daily with the left hip.  Given the success of his right total hip, he does wish to have his left hip replaced.  His pain is daily and his left hip arthritis is detrimentally affecting his mobility, his quality of life and his activities of daily  living to the point he does wish to proceed with a total hip arthroplasty on the left side.  Having had this done before, he is fully aware of the risk of acute blood loss anemia, nerve and vessel injury, fracture, infection, dislocation, DVT and  implant failure.  He understands our goals are to decrease pain, improve mobility and overall improve quality of life.  DESCRIPTION OF PROCEDURE:  After informed consent was obtained, the appropriate left hip was marked.  He was brought to the operating room and sat up on a stretcher  where they attempted spinal anesthesia.  They were unsuccessful, so he was laid in supine  position on a stretcher.  General anesthesia was obtained.  We assessed his leg lengths and then placed traction boots on both his feet.  Next, he was placed supine on the Hana fracture table with a peroneal post in place and both legs in line skeletal  traction device and no traction applied.  His left operative hip was prepped and draped with DuraPrep and sterile drapes.  A time-out was called.  He was identified, correct patient, correct left hip.  We then made an incision just inferior and posterior  to the anterior iliac spine and carried this obliquely down the leg.  We dissected down tensor fascia lata muscle.  Tensor fascia was then divided longitudinally to proceed with direct anterior approach to the hip.  We identified and cauterized  circumflex vessels.  I then identified the hip capsule, opened up the hip capsule in an L-type format, finding a moderate joint effusion and significant periarticular osteophytes around the femoral head and neck.  We placed curved retractors around the  medial and lateral femoral neck and made our femoral neck cut with an oscillating saw proximal to the lesser trochanter and completed this with an osteotome.  We placed a corkscrew guide in the femoral head and removed the femoral head in its entirety  and found a wide area devoid of cartilage.  I then placed a bent Hohmann over the medial acetabular rim and removed remnants of the acetabular labrum and other debris.  We then began reaming under direct visualization from  a size 44 reamer in stepwise  increments up to a size 57.  With all reamers under direct visualization, the last reamer was placed under direct fluoroscopy so we could obtain our depth of reaming, our inclination and anteversion.  I then placed the real DePuy Sector Gription  acetabular component size 58 and a 36+0 neutral polyethylene liner for that size  acetabular component.  Attention was then turned to the femur.  With the leg externally rotated to 120 degrees, extended and adducted, we were able to place a Mueller  retractor medially and a Hohman retractor behind the greater trochanter.  We released the lateral joint capsule and used a box-cutting osteotome to enter the femoral canal and a rongeur to lateralize it.  We then began broaching using the Corail  broaching system from a size 8, going up to size 13.  With a size 13 in place, we tried a varus offset femoral neck and a 36+1.5 hip ball, reduced this in the acetabulum.  We assessed it radiographically and mechanically and we were pleased with our leg  length, offset, range of motion and stability.  We dislocated the hip and removed the trial components.  I placed the real Corail femoral component with varus offset size 13 and the real 36+1.5 ceramic hip ball, again reduced this in the acetabulum and  we were pleased with it overall.  We then irrigated the soft tissue with normal saline solution using pulsatile lavage.  We closed the joint capsule with interrupted #1 Ethibond suture, followed by closing the tensor fascia with #1 Vicryl.  Zero Vicryl  was used to close deep tissue, 2-0 Vicryl was used to close the subcutaneous tissue and interrupted staples were used to approximate the skin.  Xeroform and Aquacel dressing was applied.  He was taken off the Hana table, awakened, extubated, and taken to  the recovery room in stable condition.  All final counts were correct.  There were no complications noted.  Of note, Benita Stabile, PA-C, assisted during the entire case and his assistance was crucial for facilitating all aspects of this case from beginning  to end.  VN/NUANCE  D:05/09/2019 T:05/09/2019 JOB:[id number]

## 2019-05-09 NOTE — Transfer of Care (Signed)
Immediate Anesthesia Transfer of Care Note  Patient: Eddie Alexander  Procedure(s) Performed: LEFT TOTAL HIP ARTHROPLASTY ANTERIOR APPROACH (Left Hip)  Patient Location: PACU  Anesthesia Type:General  Level of Consciousness: awake  Airway & Oxygen Therapy: Patient Spontanous Breathing and Patient connected to face mask oxygen  Post-op Assessment: Report given to RN and Post -op Vital signs reviewed and stable  Post vital signs: Reviewed and stable  Last Vitals:  Vitals Value Taken Time  BP 116/82 05/09/19 1357  Temp 37.3 C 05/09/19 1357  Pulse 68 05/09/19 1358  Resp 17 05/09/19 1358  SpO2 99 % 05/09/19 1358  Vitals shown include unvalidated device data.  Last Pain:  Vitals:   05/09/19 1043  TempSrc:   PainSc: 0-No pain         Complications: No apparent anesthesia complications

## 2019-05-09 NOTE — H&P (Signed)
TOTAL HIP ADMISSION H&P  Patient is admitted for left total hip arthroplasty.  Subjective:  Chief Complaint: left hip pain  HPI: Eddie Alexander, 58 y.o. male, has a history of pain and functional disability in the left hip(s) due to arthritis and patient has failed non-surgical conservative treatments for greater than 12 weeks to include NSAID's and/or analgesics, corticosteriod injections, supervised PT with diminished ADL's post treatment, use of assistive devices, weight reduction as appropriate and activity modification.  Onset of symptoms was gradual starting 3 years ago with gradually worsening course since that time.The patient noted no past surgery on the left hip(s).  Patient currently rates pain in the left hip at 10 out of 10 with activity. Patient has night pain, worsening of pain with activity and weight bearing, pain that interfers with activities of daily living and pain with passive range of motion. Patient has evidence of subchondral sclerosis, periarticular osteophytes and joint space narrowing by imaging studies. This condition presents safety issues increasing the risk of falls.  There is no current active infection.  Patient Active Problem List   Diagnosis Date Noted  . Unilateral primary osteoarthritis, left hip 05/09/2019  . Status post total replacement of right hip 05/10/2018  . Unilateral primary osteoarthritis, right hip 04/20/2018  . Class 2 obesity due to excess calories with serious comorbidity and body mass index (BMI) of 37.0 to 37.9 in adult 03/27/2016  . IFG (impaired fasting glucose) 01/24/2016  . Hypertension   . Hyperlipidemia   . CAD (coronary artery disease)   . Cervical spondylosis with myelopathy and radiculopathy 01/08/2014   Past Medical History:  Diagnosis Date  . Arthritis   . Back pain   . CAD (coronary artery disease)   . Cervical spondylosis with myelopathy   . GERD (gastroesophageal reflux disease)   . H/O urinary frequency   .  Hyperlipidemia   . Hypertension   . Myocardial infarction [facility]) 2011   caused VF arresr; s/p LAD bare metal stent; Sees Dr Romeo Apple @ [facility] annualyy s/p stent    Past Surgical History:  Procedure Laterality Date  . ANTERIOR CERVICAL DECOMP/DISCECTOMY FUSION N/A 01/08/2014   Procedure: ANTERIOR CERVICAL DECOMPRESSION/DISCECTOMY FUSION 2 LEVELS;  Surgeon: Tressie Stalker, MD;  Location: [facility] NEURO ORS;  Service: Neurosurgery;  Laterality: N/A;  Cervical Five-Six/Six-Seven Anterior Cervical Decompression with Fusoin interbody prosthesis plating and bonegraft  . CORONARY ANGIOPLASTY  [id number]   Dr Bernette Redbird at [facility]  . TONSILLECTOMY    . TOTAL HIP ARTHROPLASTY Right 05/10/2018  . TOTAL HIP ARTHROPLASTY Right 05/10/2018   Procedure: RIGHT TOTAL HIP ARTHROPLASTY ANTERIOR APPROACH;  Surgeon: Kathryne Hitch, MD;  Location: [facility] OR;  Service: Orthopedics;  Laterality: Right;  . WISDOM TOOTH EXTRACTION      Current Facility-Administered Medications  Medication Dose Route Frequency Provider Last Rate Last Dose  . ceFAZolin (ANCEF) 3 g in dextrose 5 % 50 mL IVPB  3 g Intravenous On Call to OR Kathryne Hitch, MD      . chlorhexidine (HIBICLENS) 4 % liquid 4 application  60 mL Topical Once Richardean Canal W, PA-C      . lactated ringers infusion   Intravenous Continuous Achille Rich, MD 10 mL/hr at 05/09/19 1049    . tranexamic acid (CYKLOKAPRON) 1000MG /18mL IVPB           . tranexamic acid (CYKLOKAPRON) IVPB 1,000 mg  1,000 mg Intravenous To OR 80m, PA-C       No Known Allergies  Social History   Tobacco Use  . Smoking status: Former Smoker    Types: Cigars    Quit date: 01/01/2013    Years since quitting: 6.3  . Smokeless tobacco: Never Used  Substance Use Topics  . Alcohol use: Yes    Alcohol/week: 1.0 standard drinks    Types: 1 Shots of liquor per week    Comment: per night    Family History  Problem Relation Age of Onset  . Heart disease Mother   .  Heart disease Maternal Grandfather   . Heart disease Paternal Grandmother   . Heart disease Paternal Grandfather      Review of Systems  Cardiovascular: Positive for orthopnea.  Musculoskeletal: Positive for joint pain.  All other systems reviewed and are negative.   Objective:  Physical Exam  Constitutional: He is oriented to person, place, and time. He appears well-developed and well-nourished.  HENT:  Head: Normocephalic and atraumatic.  Eyes: Pupils are equal, round, and reactive to light. EOM are normal.  Neck: Normal range of motion. Neck supple.  Cardiovascular: Normal rate.  Respiratory: Effort normal.  GI: Soft.  Musculoskeletal:     Left hip: He exhibits decreased range of motion, decreased strength, tenderness and bony tenderness.  Neurological: He is alert and oriented to person, place, and time.  Skin: Skin is warm and dry.  Psychiatric: He has a normal mood and affect.    Vital signs in last 24 hours: Temp:  [98.6 F (37 C)] 98.6 F (37 C) (12/08 1032) Pulse Rate:  [74] 74 (12/08 1032) Resp:  [20] 20 (12/08 1032) BP: (176-202)/(93-103) 176/93 (12/08 1058) SpO2:  [98 %] 98 % (12/08 1032) Weight:  [120.4 kg] 120.4 kg (12/08 1032)  Labs:   Estimated body mass index is 36 kg/m as calculated from the following:   Height as of this encounter: 6' (1.829 m).   Weight as of this encounter: 120.4 kg.   Imaging Review Plain radiographs demonstrate severe degenerative joint disease of the left hip(s). The bone quality appears to be excellent for age and reported activity level.      Assessment/Plan:  End stage arthritis, left hip(s)  The patient history, physical examination, clinical judgement of the provider and imaging studies are consistent with end stage degenerative joint disease of the left hip(s) and total hip arthroplasty is deemed medically necessary. The treatment options including medical management, injection therapy, arthroscopy and  arthroplasty were discussed at length. The risks and benefits of total hip arthroplasty were presented and reviewed. The risks due to aseptic loosening, infection, stiffness, dislocation/subluxation,  thromboembolic complications and other imponderables were discussed.  The patient acknowledged the explanation, agreed to proceed with the plan and consent was signed. Patient is being admitted for inpatient treatment for surgery, pain control, PT, OT, prophylactic antibiotics, VTE prophylaxis, progressive ambulation and ADL's and discharge planning.The patient is planning to be discharged home with home health services    Patient's anticipated LOS is less than 2 midnights, meeting these requirements: - Younger than 38 - Lives within 1 hour of care - Has a competent adult at home to recover with post-op recover - NO history of  - Chronic pain requiring opiods  - Diabetes  - Coronary Artery Disease  - Heart failure  - Heart attack  - Stroke  - DVT/VTE  - Cardiac arrhythmia  - Respiratory Failure/COPD  - Renal failure  - Anemia  - Advanced Liver disease

## 2019-05-09 NOTE — Anesthesia Procedure Notes (Signed)
Procedure Name: Intubation Performed by: Milford Cage, CRNA Pre-anesthesia Checklist: Patient identified, Emergency Drugs available, Suction available and Patient being monitored Patient Re-evaluated:Patient Re-evaluated prior to induction Oxygen Delivery Method: Circle System Utilized Preoxygenation: Pre-oxygenation with 100% oxygen Induction Type: IV induction Ventilation: Mask ventilation without difficulty Laryngoscope Size: Glidescope and 4 Grade View: Grade I Tube type: Oral Number of attempts: 1 Airway Equipment and Method: Stylet Placement Confirmation: ETT inserted through vocal cords under direct vision,  positive ETCO2 and breath sounds checked- equal and bilateral Secured at: 24 cm Tube secured with: Tape Dental Injury: Teeth and Oropharynx as per pre-operative assessment  Difficulty Due To: Difficulty was anticipated and Difficult Airway- due to reduced neck mobility Comments: Previous ACDF

## 2019-05-09 NOTE — Evaluation (Signed)
Physical Therapy Evaluation Patient Details Name: Eddie Alexander MRN: [id number] DOB: September 13, 1960 Today's Date: 05/09/2019   History of Present Illness  Patient is a 58 y/o male s/p L THA.  Last year underwent R THA.  Other PMH includes HTN, CAD, cervical spondylosis (s/p ACDF C5-7).  Clinical Impression  Patient presents with decreased mobility due to deficits listed in PT problem list.  Currently min A to minguard for mobility.  Will have wife assist at home.  Plan to practice steps prior to d/c as bedroom on second floor.  Did review sequence with pt today.  PT to follow.     Follow Up Recommendations Follow surgeon's recommendation for DC plan and follow-up therapies    Equipment Recommendations  None recommended by PT    Recommendations for Other Services       Precautions / Restrictions Precautions Precautions: Fall Restrictions Weight Bearing Restrictions: Yes LLE Weight Bearing: Weight bearing as tolerated      Mobility  Bed Mobility Overal bed mobility: Needs Assistance Bed Mobility: Supine to Sit     Supine to sit: Min assist     General bed mobility comments: assist for L LE and pt pulling up with HHA  Transfers Overall transfer level: Needs assistance Equipment used: Rolling walker (2 wheeled) Transfers: Sit to/from Stand Sit to Stand: From elevated surface;Min assist         General transfer comment: up from EOB elevated due to hips below knees at standard height; assist for balance  Ambulation/Gait Ambulation/Gait assistance: Min guard;Supervision Gait Distance (Feet): 150 Feet Assistive device: Rolling walker (2 wheeled) Gait Pattern/deviations: Step-to pattern;Step-through pattern;Antalgic;Decreased stance time - left     General Gait Details: some buckling of L knee with weight bearing when trying to place more weight on leg.  Uses RW appropriately to [facility] for safe ambulation in hallway  Stairs Stairs: (reviewed sequence)           Wheelchair Mobility    Modified Rankin (Stroke Patients Only)       Balance Overall balance assessment: Needs assistance   Sitting balance-Leahy Scale: Good     Standing balance support: Bilateral upper extremity supported;No upper extremity supported Standing balance-Leahy Scale: Fair Standing balance comment: able to use urinal in standing prior to sitting on chair                             Pertinent Vitals/Pain Pain Assessment: 0-10 Pain Score: 8  Pain Location: L hip Pain Descriptors / Indicators: Tightness Pain Intervention(s): Monitored during session;Repositioned    Home Living Family/patient expects to be discharged to:: Private residence Living Arrangements: Spouse/significant other Available Help at Discharge: Family;Available 24 hours/day Type of Home: House Home Access: Stairs to enter Entrance Stairs-Rails: Right Entrance Stairs-Number of Steps: 3 Home Layout: Bed/bath upstairs;Two level;1/2 bath on main level Home Equipment: Walker - 2 wheels;Bedside commode      Prior Function Level of Independence: Independent with assistive device(s)         Comments: using cane PTA     Hand Dominance        Extremity/Trunk Assessment   Upper Extremity Assessment Upper Extremity Assessment: Overall WFL for tasks assessed    Lower Extremity Assessment Lower Extremity Assessment: LLE deficits/detail LLE Deficits / Details: AAROM WFL, strength hip flexion <3/5, knee extension at least 3/5 LLE Sensation: WNL       Communication   Communication: No difficulties  Cognition Arousal/Alertness:  Awake/alert Behavior During Therapy: WFL for tasks assessed/performed Overall Cognitive Status: Within Functional Limits for tasks assessed                                        General Comments      Exercises Total Joint Exercises Ankle Circles/Pumps: 10 reps;Supine;Both Quad Sets: 10 reps;Supine;Left Heel Slides: AAROM;5  reps;Supine;Left   Assessment/Plan    PT Assessment Patient needs continued PT services  PT Problem List Pain;Decreased strength;Decreased mobility;Decreased knowledge of use of DME;Decreased range of motion       PT Treatment Interventions Gait training;Functional mobility training;Therapeutic exercise;Patient/family education;Stair training;DME instruction;Therapeutic activities    PT Goals (Current goals can be found in the Care Plan section)  Acute Rehab PT Goals Patient Stated Goal: to go home PT Goal Formulation: With patient/family Time For Goal Achievement: 05/23/19 Potential to Achieve Goals: Good    Frequency 7X/week   Barriers to discharge        Co-evaluation               AM-PAC PT "6 Clicks" Mobility  Outcome Measure Help needed turning from your back to your side while in a flat bed without using bedrails?: A Little Help needed moving from lying on your back to sitting on the side of a flat bed without using bedrails?: A Little Help needed moving to and from a bed to a chair (including a wheelchair)?: A Little Help needed standing up from a chair using your arms (e.g., wheelchair or bedside chair)?: A Little Help needed to walk in hospital room?: A Little Help needed climbing 3-5 steps with a railing? : A Little 6 Click Score: 18    End of Session   Activity Tolerance: Patient tolerated treatment well Patient left: in chair;with call bell/phone within reach;with family/visitor present   PT Visit Diagnosis: Difficulty in walking, not elsewhere classified (R26.2);Pain Pain - Right/Left: Left Pain - part of body: Hip    Time: 1555-1620 PT Time Calculation (min) (ACUTE ONLY): 25 min   Charges:   PT Evaluation $PT Eval Low Complexity: 1 Low PT Treatments $Gait Training: 8-22 mins        Sheran Lawless, [place] [facility] [phone] 05/09/2019   Eddie Alexander 05/09/2019, 5:18 PM

## 2019-05-09 NOTE — Care Management (Signed)
CM consult acknowledged to assist with any HH/DME needs. Awaiting PT/OT eval for DCP recommendations and will continue to follow.  Kylea Berrong MSN, RN, NCM-BC, ACM-RN [phone] 

## 2019-05-10 ENCOUNTER — Encounter (HOSPITAL_COMMUNITY): Payer: Self-pay | Admitting: Orthopaedic Surgery

## 2019-05-10 DIAGNOSIS — M1612 Unilateral primary osteoarthritis, left hip: Secondary | ICD-10-CM | POA: Diagnosis not present

## 2019-05-10 LAB — BASIC METABOLIC PANEL
Anion gap: 11 (ref 5–15)
BUN: 18 mg/dL (ref 6–20)
CO2: 24 mmol/L (ref 22–32)
Calcium: 8.6 mg/dL — ABNORMAL LOW (ref 8.9–10.3)
Chloride: 99 mmol/L (ref 98–111)
Creatinine, Ser: 1.04 mg/dL (ref 0.61–1.24)
GFR calc Af Amer: >60 mL/min (ref >60)
GFR calc non Af Amer: >60 mL/min (ref >60)
Glucose, Bld: 193 mg/dL — ABNORMAL HIGH (ref 70–99)
Potassium: 4.4 mmol/L (ref 3.5–5.1)
Sodium: 134 mmol/L — ABNORMAL LOW (ref 135–145)

## 2019-05-10 LAB — CBC
HCT: 33.5 % — ABNORMAL LOW (ref 39.0–52.0)
Hemoglobin: 12.1 g/dL — ABNORMAL LOW (ref 13.0–17.0)
MCH: 32.3 pg (ref 26.0–34.0)
MCHC: 36.1 g/dL — ABNORMAL HIGH (ref 30.0–36.0)
MCV: 89.3 fL (ref 80.0–100.0)
Platelets: 152 10*3/uL (ref 150–400)
RBC: 3.75 MIL/uL — ABNORMAL LOW (ref 4.22–5.81)
RDW: 12.5 % (ref 11.5–15.5)
WBC: 9.7 10*3/uL (ref 4.0–10.5)
nRBC: 0 % (ref 0.0–0.2)

## 2019-05-10 MED ORDER — OXYCODONE HCL 5 MG PO TABS: 5.0000 mg | ORAL_TABLET | ORAL | 0 refills | Status: AC | PRN

## 2019-05-10 MED ORDER — METHOCARBAMOL 500 MG PO TABS: 500.0000 mg | ORAL_TABLET | Freq: Four times a day (QID) | ORAL | 1 refills | Status: AC | PRN

## 2019-05-10 MED ORDER — DOCUSATE SODIUM 100 MG PO CAPS: 100.0000 mg | ORAL_CAPSULE | Freq: Two times a day (BID) | ORAL | 0 refills | Status: AC

## 2019-05-10 NOTE — Plan of Care (Signed)
  Problem: Activity: Goal: Risk for activity intolerance will decrease Outcome: Progressing   

## 2019-05-10 NOTE — Progress Notes (Signed)
Physical Therapy Treatment Patient Details Name: Eddie Alexander MRN: [id number] DOB: 1960/12/03 Today's Date: 05/10/2019    History of Present Illness Patient is a 58 y/o male s/p L THA.  Last year underwent R THA.  Other PMH includes HTN, CAD, cervical spondylosis (s/p ACDF C5-7).    PT Comments    Pt making steady progress with functional mobility. He tolerated stair training well this session with min guard and good sequencing utilized. Plan is for pt to d/c home today. He is ready for d/c from PT perspective.   Pt would continue to benefit from skilled physical therapy services at this time while admitted and after d/c to address the below listed limitations in order to improve overall safety and independence with functional mobility.    Follow Up Recommendations  Outpatient PT;Other (comment)(pt stating someone told him HHPT was not an option)     Equipment Recommendations  None recommended by PT    Recommendations for Other Services       Precautions / Restrictions Precautions Precautions: Fall Restrictions Weight Bearing Restrictions: Yes LLE Weight Bearing: Weight bearing as tolerated    Mobility  Bed Mobility               General bed mobility comments: pt OOB in recliner chair upon arrival  Transfers Overall transfer level: Needs assistance Equipment used: Rolling walker (2 wheeled) Transfers: Sit to/from Stand Sit to Stand: Supervision         General transfer comment: good technique, supervision for safety  Ambulation/Gait Ambulation/Gait assistance: Min guard;Supervision Gait Distance (Feet): 100 Feet Assistive device: Rolling walker (2 wheeled) Gait Pattern/deviations: Step-to pattern;Step-through pattern;Decreased stance time - left Gait velocity: decreased   General Gait Details: pt overall steady with RW, no knee buckling noted, pt with emerging step-through gait pattern   Stairs Stairs: Yes Stairs assistance: Min guard Stair  Management: One rail Right;Step to pattern;Backwards;With cane Number of Stairs: 3 General stair comments: to simulate home set-up; pt using R hand rail and cane in L hand, pt with good recall from previous THA on sequencing, min guard for safety   Wheelchair Mobility    Modified Rankin (Stroke Patients Only)       Balance Overall balance assessment: Needs assistance Sitting-balance support: Feet supported Sitting balance-Leahy Scale: Good     Standing balance support: Single extremity supported;Bilateral upper extremity supported Standing balance-Leahy Scale: Poor                              Cognition Arousal/Alertness: Awake/alert Behavior During Therapy: WFL for tasks assessed/performed Overall Cognitive Status: Within Functional Limits for tasks assessed                                        Exercises      General Comments        Pertinent Vitals/Pain Pain Assessment: 0-10 Pain Score: 2  Pain Location: L hip Pain Descriptors / Indicators: Sore Pain Intervention(s): Monitored during session;Patient requesting pain meds-RN notified    Home Living                      Prior Function            PT Goals (current goals can now be found in the care plan section) Acute Rehab PT Goals PT Goal Formulation:  With patient/family Time For Goal Achievement: 05/23/19 Potential to Achieve Goals: Good Progress towards PT goals: Progressing toward goals    Frequency    7X/week      PT Plan Current plan remains appropriate    Co-evaluation              AM-PAC PT "6 Clicks" Mobility   Outcome Measure  Help needed turning from your back to your side while in a flat bed without using bedrails?: A Little Help needed moving from lying on your back to sitting on the side of a flat bed without using bedrails?: A Little Help needed moving to and from a bed to a chair (including a wheelchair)?: A Little Help needed  standing up from a chair using your arms (e.g., wheelchair or bedside chair)?: A Little Help needed to walk in hospital room?: A Little Help needed climbing 3-5 steps with a railing? : A Little 6 Click Score: 18    End of Session Equipment Utilized During Treatment: Gait belt Activity Tolerance: Patient tolerated treatment well Patient left: in chair;with call bell/phone within reach Nurse Communication: Mobility status PT Visit Diagnosis: Difficulty in walking, not elsewhere classified (R26.2);Pain Pain - Right/Left: Left Pain - part of body: Hip     Time: 0930-0950 PT Time Calculation (min) (ACUTE ONLY): 20 min  Charges:  $Gait Training: 8-22 mins                     Anastasio Champion, DPT  Acute Rehabilitation Services Pager [phone] Office [place] 05/10/2019, 10:24 AM

## 2019-05-10 NOTE — Progress Notes (Signed)
Subjective: 1 Day Post-Op Procedure(s) (LRB): LEFT TOTAL HIP ARTHROPLASTY ANTERIOR APPROACH (Left) Patient reports pain as mild.  Needs to work with PT on steps.  Objective: Vital signs in last 24 hours: Temp:  [97 F (36.1 C)-99.1 F (37.3 C)] 98.4 F (36.9 C) (12/09 0816) Pulse Rate:  [64-78] 78 (12/09 0816) Resp:  [13-20] 16 (12/09 0816) BP: (116-202)/(69-103) 143/87 (12/09 0816) SpO2:  [93 %-100 %] 97 % (12/09 0816) Weight:  [120.4 kg] 120.4 kg (12/08 1032)  Intake/Output from previous day: 12/08 0701 - 12/09 0700 In: 2410.8 [P.O.:490; I.V.:1770.8; IV Piggyback:100] Out: 450 [Urine:100; Blood:350] Intake/Output this shift: No intake/output data recorded.  Recent Labs    05/10/19 0330  HGB 12.1*   Recent Labs    05/10/19 0330  WBC 9.7  RBC 3.75*  HCT 33.5*  PLT 152   Recent Labs    05/10/19 0330  NA 134*  K 4.4  CL 99  CO2 24  BUN 18  CREATININE 1.04  GLUCOSE 193*  CALCIUM 8.6*   No results for input(s): LABPT, INR in the last 72 hours.  Dorsiflexion/Plantar flexion intact Incision: dressing C/D/I Compartment soft   Assessment/Plan: 1 Day Post-Op Procedure(s) (LRB): LEFT TOTAL HIP ARTHROPLASTY ANTERIOR APPROACH (Left) Up with therapy  Will discharge home today after PT .      Eddie Alexander 05/10/2019, 8:20 AM

## 2019-05-10 NOTE — Progress Notes (Signed)
Discharge paperwork and instructions given to pt. Pt not in distress and tolerated well. 

## 2019-05-10 NOTE — Discharge Instructions (Signed)

## 2019-05-10 NOTE — Anesthesia Postprocedure Evaluation (Signed)
Anesthesia Post Note  Patient: Eddie Alexander  Procedure(s) Performed: LEFT TOTAL HIP ARTHROPLASTY ANTERIOR APPROACH (Left Hip)     Patient location during evaluation: PACU Anesthesia Type: General Level of consciousness: awake and alert Pain management: pain level controlled Vital Signs Assessment: post-procedure vital signs reviewed and stable Respiratory status: spontaneous breathing, nonlabored ventilation, respiratory function stable and patient connected to nasal cannula oxygen Cardiovascular status: blood pressure returned to baseline and stable Postop Assessment: no apparent nausea or vomiting Anesthetic complications: no    Last Vitals:  Vitals:   05/10/19 0356 05/10/19 0816  BP: 135/69 (!) 143/87  Pulse: 72 78  Resp: 16 16  Temp: 36.8 C 36.9 C  SpO2: 97% 97%    Last Pain:  Vitals:   05/10/19 0816  TempSrc: Oral  PainSc:                  [place]

## 2019-05-10 NOTE — Discharge Summary (Signed)
Patient ID: Eddie Alexander MRN: [id number] DOB/AGE: Jun 06, 1960 58 y.o.  Admit date: 05/09/2019 Discharge date: 05/10/2019  Admission Diagnoses:  Principal Problem:   Unilateral primary osteoarthritis, left hip Active Problems:   Status post total replacement of left hip   Discharge Diagnoses:  Same  Past Medical History:  Diagnosis Date  . Arthritis   . Back pain   . CAD (coronary artery disease)   . Cervical spondylosis with myelopathy   . GERD (gastroesophageal reflux disease)   . H/O urinary frequency   . Hyperlipidemia   . Hypertension   . Myocardial infarction [facility]) 2011   caused VF arresr; s/p LAD bare metal stent; Sees Dr Aline Brochure @ [facility] annualyy s/p stent    Surgeries: Procedure(s): LEFT TOTAL HIP ARTHROPLASTY ANTERIOR APPROACH on 05/09/2019   Consultants:   Discharged Condition: Improved  Hospital Course: Eddie Alexander is an 58 y.o. male who was admitted 05/09/2019 for operative treatment ofUnilateral primary osteoarthritis, left hip. Patient has severe unremitting pain that affects sleep, daily activities, and work/hobbies. After pre-op clearance the patient was taken to the operating room on 05/09/2019 and underwent  Procedure(s): LEFT TOTAL HIP ARTHROPLASTY ANTERIOR APPROACH.    Patient was given perioperative antibiotics:  Anti-infectives (From admission, onward)   Start     Dose/Rate Route Frequency Ordered Stop   05/09/19 1800  ceFAZolin (ANCEF) IVPB 1 g/50 mL premix     1 g 100 mL/hr over 30 Minutes Intravenous Every 6 hours 05/09/19 1514 05/10/19 0049   05/09/19 0400  ceFAZolin (ANCEF) 3 g in dextrose 5 % 50 mL IVPB     3 g 100 mL/hr over 30 Minutes Intravenous On call to O.R. 05/08/19 1020 05/09/19 1205       Patient was given sequential compression devices, early ambulation, and chemoprophylaxis to prevent DVT.  Patient benefited maximally from hospital stay and there were no complications.    Recent vital signs:  Patient Vitals for the  past 24 hrs:  BP Temp Temp src Pulse Resp SpO2 Height Weight  05/10/19 0816 (!) 143/87 98.4 F (36.9 C) Oral 78 16 97 % - -  05/10/19 0356 135/69 98.3 F (36.8 C) Oral 72 16 97 % - -  05/10/19 0023 (!) 160/85 (!) 97.4 F (36.3 C) Oral 75 17 99 % - -  05/09/19 1957 (!) 143/84 98.1 F (36.7 C) - 64 17 97 % - -  05/09/19 1806 (!) 145/86 - - 70 18 100 % - -  05/09/19 1517 (!) 178/97 97.6 F (36.4 C) Oral 67 16 99 % - -  05/09/19 1457 (!) 167/96 (!) 97 F (36.1 C) - 64 20 100 % - -  05/09/19 1442 (!) 166/93 - - 65 18 93 % - -  05/09/19 1427 (!) 150/97 - - 69 15 100 % - -  05/09/19 1412 (!) 151/85 - - 75 13 97 % - -  05/09/19 1357 116/82 99.1 F (37.3 C) - 72 18 96 % - -  05/09/19 1058 (!) 176/93 - - - - - - -  05/09/19 1032 (!) 202/103 98.6 F (37 C) Oral 74 20 98 % 6' (1.829 m) 120.4 kg     Recent laboratory studies:  Recent Labs    05/10/19 0330  WBC 9.7  HGB 12.1*  HCT 33.5*  PLT 152  NA 134*  K 4.4  CL 99  CO2 24  BUN 18  CREATININE 1.04  GLUCOSE 193*  CALCIUM 8.6*  Discharge Medications:   Allergies as of 05/10/2019   No Known Allergies     Medication List    STOP taking these medications   acetaminophen-codeine 300-30 MG tablet Commonly known as: TYLENOL #3   ibuprofen 200 MG tablet Commonly known as: ADVIL     TAKE these medications   aspirin EC 81 MG tablet Take 81 mg by mouth daily.   atorvastatin 20 MG tablet Commonly known as: LIPITOR Take 20 mg by mouth every evening.   carvedilol 25 MG tablet Commonly known as: COREG Take 25 mg by mouth 2 (two) times daily.   Centrum Silver Chew Chew 1 tablet by mouth daily.   clopidogrel 75 MG tablet Commonly known as: PLAVIX Take 75 mg by mouth daily.   diazepam 5 MG tablet Commonly known as: VALIUM Take 1 tablet (5 mg total) by mouth daily as needed for anxiety.   diclofenac 50 MG EC tablet Commonly known as: VOLTAREN TAKE 1 TAB DAILY AFTER BREAKFAST X7 DAYS, THEN 1 TAB 2X DAILY X7 DAYS,  THEN 1 TAB 3X DAILY X28 DAYS. What changed: See the new instructions.   docusate sodium 100 MG capsule Commonly known as: COLACE Take 1 capsule (100 mg total) by mouth 2 (two) times daily.   Fish Oil 1200 MG Caps Take 1,200 mg by mouth 3 (three) times a week.   lisinopril 40 MG tablet Commonly known as: ZESTRIL Take 40 mg by mouth daily.   methocarbamol 500 MG tablet Commonly known as: ROBAXIN Take 1 tablet (500 mg total) by mouth every 6 (six) hours as needed for muscle spasms.   oxyCODONE 5 MG immediate release tablet Commonly known as: Oxy IR/ROXICODONE Take 1-2 tablets (5-10 mg total) by mouth every 4 (four) hours as needed for up to 7 days for moderate pain (pain score 4-6). What changed:   how much to take  when to take this   Rewetting Drops Soln Place 1 drop into both eyes as needed (dry contact lenses.).   Vitamin D3 50 MCG (2000 UT) Tabs Take 2,000 Units by mouth daily.            Durable Medical Equipment  (From admission, onward)         Start     Ordered   05/09/19 1515  DME 3 n 1  Once     05/09/19 1514   05/09/19 1515  DME Walker rolling  Once    Question:  Patient needs a walker to treat with the following condition  Answer:  Status post total replacement of left hip   05/09/19 1514          Diagnostic Studies: Dg Pelvis Portable  Result Date: 05/09/2019 CLINICAL DATA:  Status post left hip replacement EXAM: PORTABLE PELVIS 1-2 VIEWS COMPARISON:  None. FINDINGS: The patient is status post left hip replacement. Hardware is in good position. Skin staples and postoperative soft tissue air are identified. IMPRESSION: Left hip replacement as above. Electronically Signed   By: Gerome Sam III M.D   On: 05/09/2019 14:42   Dg C-arm 1-60 Min  Result Date: 05/09/2019 CLINICAL DATA:  Left total hip arthroplasty. EXAM: DG C-ARM 1-60 MIN; OPERATIVE LEFT HIP WITH PELVIS FLUOROSCOPY TIME:  Fluoroscopy Time:  19 seconds COMPARISON:  None. FINDINGS:  Interval left total hip arthroplasty.  Normal alignment. Prior right total hip arthroplasty. IMPRESSION: Interval left total hip arthroplasty. Electronically Signed   By: Elige Ko   On: 05/09/2019 13:47   Dg Hip  Operative Unilat W Or W/o Pelvis Left  Result Date: 05/09/2019 CLINICAL DATA:  Left total hip arthroplasty. EXAM: DG C-ARM 1-60 MIN; OPERATIVE LEFT HIP WITH PELVIS FLUOROSCOPY TIME:  Fluoroscopy Time:  19 seconds COMPARISON:  None. FINDINGS: Interval left total hip arthroplasty.  Normal alignment. Prior right total hip arthroplasty. IMPRESSION: Interval left total hip arthroplasty. Electronically Signed   By: Elige KoHetal  Patel   On: 05/09/2019 13:47    Disposition:  Discharge to Home   Follow-up Information    Kathryne HitchBlackman, Christopher Y, MD. Schedule an appointment as soon as possible for a visit in 2 week(s).   Specialty: Orthopedic Surgery Contact information: [address] [place] [address] [phone]            Signed: Richardean CanalGILBERT Breawna Montenegro 05/10/2019, 8:39 AM

## 2019-05-12 ENCOUNTER — Telehealth: Payer: Self-pay | Admitting: Orthopaedic Surgery

## 2019-05-12 NOTE — Telephone Encounter (Signed)
Eddie Alexander with Eddie Alexander called. Would like to inform Dr. Ninfa Linden that they will be going out to see patient starting 05/13/2019. Her call back number is [phone].

## 2019-05-15 ENCOUNTER — Telehealth: Payer: Self-pay | Admitting: Orthopaedic Surgery

## 2019-05-15 NOTE — Telephone Encounter (Signed)
Verbal order given to jerry

## 2019-05-15 NOTE — Telephone Encounter (Signed)
Received call from Laurey Arrow with [facility] needing verbal orders for HHPT 1 Wk 1, 3 Wk 1 and twice a week for 1 week. The number to contact Verdis Frederickson is [phone]

## 2019-05-23 ENCOUNTER — Encounter: Payer: Self-pay | Admitting: Orthopaedic Surgery

## 2019-05-23 ENCOUNTER — Other Ambulatory Visit: Payer: Self-pay

## 2019-05-23 ENCOUNTER — Ambulatory Visit (INDEPENDENT_AMBULATORY_CARE_PROVIDER_SITE_OTHER): Payer: BC Managed Care – PPO | Admitting: Orthopaedic Surgery

## 2019-05-23 DIAGNOSIS — Z96642 Presence of left artificial hip joint: Secondary | ICD-10-CM

## 2019-05-23 MED ORDER — OXYCODONE HCL 5 MG PO TABS: 5.0000 mg | ORAL_TABLET | Freq: Four times a day (QID) | ORAL | 0 refills | Status: AC | PRN

## 2019-05-23 NOTE — Progress Notes (Signed)
The patient is 2 weeks today status post a left total hip arthroplasty.  We replaced his right hip primarily.  He is doing well.  He is walking with a cane.  He does take a baby aspirin daily as well as Plavix.  He is not had any calf pain or significant swelling in his foot.  He does need a refill of oxycodone which I do feel is appropriate.  On exam he is walking with a cane.  His left hip incision looks good.  Remove the staples in place Steri-Strips.  There is moderate swelling but no seroma this is more of a hematoma.  He will try heat twice a day to this area on his leg and give this time.  He will continue to increase activities as comfort allows.  I will refill his oxycodone I will see him back in 4 weeks to see how he is doing overall.

## 2019-06-22 ENCOUNTER — Encounter: Payer: Self-pay | Admitting: Orthopaedic Surgery

## 2019-06-22 ENCOUNTER — Ambulatory Visit (INDEPENDENT_AMBULATORY_CARE_PROVIDER_SITE_OTHER): Payer: BC Managed Care – PPO | Admitting: Orthopaedic Surgery

## 2019-06-22 ENCOUNTER — Other Ambulatory Visit: Payer: Self-pay

## 2019-06-22 DIAGNOSIS — Z96642 Presence of left artificial hip joint: Secondary | ICD-10-CM

## 2019-06-22 DIAGNOSIS — Z96641 Presence of right artificial hip joint: Secondary | ICD-10-CM

## 2019-06-22 MED ORDER — DICLOFENAC SODIUM 50 MG PO TBEC: 50.0000 mg | DELAYED_RELEASE_TABLET | Freq: Two times a day (BID) | ORAL | 3 refills | Status: DC | PRN

## 2019-06-22 NOTE — Addendum Note (Signed)
Addended by: Doneen Poisson on: 06/22/2019 03:53 PM   Modules accepted: Orders

## 2019-06-22 NOTE — Progress Notes (Signed)
The patient is now 6 weeks status post a left total hip arthroplasty.  We have also replaced his right hip before.  He says he is doing well overall and he reports good strength and range of motion of both hips.  He does wish for a refill of diclofenac but that helped him with inflammatory pain in the past.  He is walking without any significant limp.  His leg lengths are great.  He has full range of motion of both hips.  He is very satisfied as an MRI.  At this point we do not need to see him back for 6 months unless there are issues.  At that visit I like a standing low AP pelvis and lateral of both hips.  All questions and concerns were answered and addressed.

## 2019-09-14 ENCOUNTER — Encounter: Payer: Self-pay | Admitting: Orthopaedic Surgery

## 2019-09-14 ENCOUNTER — Other Ambulatory Visit: Payer: Self-pay

## 2019-09-14 ENCOUNTER — Ambulatory Visit: Payer: BC Managed Care – PPO | Admitting: Orthopaedic Surgery

## 2019-09-14 ENCOUNTER — Ambulatory Visit: Payer: Self-pay

## 2019-09-14 DIAGNOSIS — G8929 Other chronic pain: Secondary | ICD-10-CM | POA: Diagnosis not present

## 2019-09-14 DIAGNOSIS — Z96641 Presence of right artificial hip joint: Secondary | ICD-10-CM

## 2019-09-14 DIAGNOSIS — Z96642 Presence of left artificial hip joint: Secondary | ICD-10-CM

## 2019-09-14 DIAGNOSIS — M5442 Lumbago with sciatica, left side: Secondary | ICD-10-CM | POA: Diagnosis not present

## 2019-09-14 DIAGNOSIS — M4807 Spinal stenosis, lumbosacral region: Secondary | ICD-10-CM

## 2019-09-14 NOTE — Progress Notes (Signed)
Office Visit Note   Patient: Eddie Alexander           Date of Birth: 11-Nov-1960           MRN: [id number] Visit Date: 09/14/2019              Requested by: Steele Sizer, MD No address on file PCP: Steele Sizer, MD   Assessment & Plan: Visit Diagnoses:  1. Status post total replacement of left hip   2. Status post total replacement of right hip   3. Chronic bilateral low back pain with left-sided sciatica     Plan: Given the significant radicular symptoms of low back pain with this rating down the left leg and weakness, I am recommending a MRI of the lumbar spine to assess the degree of stenosis and to assess for nerve impingement.  This is also given his plain film findings and his exam in the office today.  All question concerns were answered addressed.  I will see him back in 2 weeks to go over the MRI of his lumbar spine.  Follow-Up Instructions: Return in about 2 weeks (around 09/28/2019).   Orders:  Orders Placed This Encounter  Procedures  . XR HIPS BILAT W OR W/O PELVIS 2V  . XR Lumbar Spine 2-3 Views   No orders of the defined types were placed in this encounter.     Procedures: No procedures performed   Clinical Data: No additional findings.   Subjective: Chief Complaint  Patient presents with  . Left Hip - Pain  . Right Hip - Pain  The patient comes in today with significant low back pain with radicular symptoms going down his left leg.  He does have a history of bilateral hip replacements.  We replaced the right hip in December 2019 and the left hip in December 2020.  He does ambulate with a cane.  He reports weakness in his left lower extremity as well.  He also has a history of previous cervical spine surgery and continue radicular symptoms from that as well.  His mobility has been significantly affected.  He does have pain on a daily basis and this is detrimentally affecting his quality of life and his actives daily living.  It is becoming  quite disabling for him.  He denies any change in bowel bladder function.  HPI  Review of Systems He currently denies any headache, chest pain, shortness of breath, fever, chills, nausea, vomiting  Objective: Vital Signs: There were no vitals taken for this visit.  Physical Exam He is alert and orient x3 and in no acute distress Ortho Exam Examination of his lumbar spine shows significant pain with any flexion or extension.  He has a positive straight leg raise to the left side with weakness down his left lower extremity that is not related to his recent hip replacement surgery was done over 4 months ago.  His leg lengths are equal.  He is got better strength on his right side.  Both hips move smoothly.  He is ambulate with a cane.  He has a weak foot dorsiflexion on the left side with subjective numbness in the 4 and L5 dermatomes. Specialty Comments:  No specialty comments available.  Imaging: XR HIPS BILAT W OR W/O PELVIS 2V  Result Date: 09/14/2019 A low AP pelvis and lateral both hips shows well-seated total hip arthroplasties with no complicating features.  XR Lumbar Spine 2-3 Views  Result Date: 09/14/2019 There is  significant degenerative changes in the lumbar spine at multiple levels.  Most prominent is at L5-S1 but there is significant narrowing at L5- L5 and L2-L3.  There is loss of lumbar lordosis and significant degenerative findings and osteophytes at multiple levels.    PMFS History: Patient Active Problem List   Diagnosis Date Noted  . Unilateral primary osteoarthritis, left hip 05/09/2019  . Status post total replacement of left hip 05/09/2019  . Status post total replacement of right hip 05/10/2018  . Unilateral primary osteoarthritis, right hip 04/20/2018  . Class 2 obesity due to excess calories with serious comorbidity and body mass index (BMI) of 37.0 to 37.9 in adult 03/27/2016  . IFG (impaired fasting glucose) 01/24/2016  . Hypertension   . Hyperlipidemia    . CAD (coronary artery disease)   . Cervical spondylosis with myelopathy and radiculopathy 01/08/2014   Past Medical History:  Diagnosis Date  . Arthritis   . Back pain   . CAD (coronary artery disease)   . Cervical spondylosis with myelopathy   . GERD (gastroesophageal reflux disease)   . H/O urinary frequency   . Hyperlipidemia   . Hypertension   . Myocardial infarction [facility]) 2011   caused VF arresr; s/p LAD bare metal stent; Sees Dr Aline Brochure @ [facility] annualyy s/p stent    Family History  Problem Relation Age of Onset  . Heart disease Mother   . Heart disease Maternal Grandfather   . Heart disease Paternal Grandmother   . Heart disease Paternal Grandfather     Past Surgical History:  Procedure Laterality Date  . ANTERIOR CERVICAL DECOMP/DISCECTOMY FUSION N/A 01/08/2014   Procedure: ANTERIOR CERVICAL DECOMPRESSION/DISCECTOMY FUSION 2 LEVELS;  Surgeon: Newman Pies, MD;  Location: [place] [facility];  Service: Neurosurgery;  Laterality: N/A;  Cervical Five-Six/Six-Seven Anterior Cervical Decompression with Fusoin interbody prosthesis plating and bonegraft  . CORONARY ANGIOPLASTY  [id number]   Dr Jenne Pane at [facility]  . TONSILLECTOMY    . TOTAL HIP ARTHROPLASTY Right 05/10/2018  . TOTAL HIP ARTHROPLASTY Right 05/10/2018   Procedure: RIGHT TOTAL HIP ARTHROPLASTY ANTERIOR APPROACH;  Surgeon: Mcarthur Rossetti, MD;  Location: [place];  Service: Orthopedics;  Laterality: Right;  . TOTAL HIP ARTHROPLASTY Left 05/09/2019   Procedure: LEFT TOTAL HIP ARTHROPLASTY ANTERIOR APPROACH;  Surgeon: Mcarthur Rossetti, MD;  Location: [place];  Service: Orthopedics;  Laterality: Left;  . WISDOM TOOTH EXTRACTION     Social History   Occupational History  . Not on file  Tobacco Use  . Smoking status: Former Smoker    Types: Cigars    Quit date: 01/01/2013    Years since quitting: 6.7  . Smokeless tobacco: Never Used  Substance and Sexual Activity  . Alcohol use: Yes    Alcohol/week:  1.0 standard drinks    Types: 1 Shots of liquor per week    Comment: per night  . Drug use: No  . Sexual activity: Yes

## 2019-09-28 ENCOUNTER — Ambulatory Visit: Payer: BC Managed Care – PPO | Admitting: Orthopaedic Surgery

## 2019-10-21 ENCOUNTER — Other Ambulatory Visit: Payer: Self-pay

## 2019-10-21 ENCOUNTER — Ambulatory Visit
Admission: RE | Admit: 2019-10-21 | Discharge: 2019-10-21 | Disposition: A | Discharge status: Home / Self Care | Payer: BC Managed Care – PPO | Source: Ambulatory Visit | Attending: Orthopaedic Surgery | Admitting: Orthopaedic Surgery

## 2019-10-21 DIAGNOSIS — M4807 Spinal stenosis, lumbosacral region: Secondary | ICD-10-CM

## 2019-10-25 ENCOUNTER — Other Ambulatory Visit: Payer: Self-pay

## 2019-10-25 ENCOUNTER — Encounter: Payer: Self-pay | Admitting: Orthopaedic Surgery

## 2019-10-25 ENCOUNTER — Ambulatory Visit (INDEPENDENT_AMBULATORY_CARE_PROVIDER_SITE_OTHER): Payer: BC Managed Care – PPO | Admitting: Orthopaedic Surgery

## 2019-10-25 DIAGNOSIS — M4807 Spinal stenosis, lumbosacral region: Secondary | ICD-10-CM | POA: Diagnosis not present

## 2019-10-25 NOTE — Progress Notes (Signed)
The patient comes in today to go over an MRI of his lumbar spine.  He had actually seen Dr. Delma Officer with neurosurgery in the past for his spine and has had a remote epidural steroid injection.  He is also follow-up with me from previous hip surgery.  He has had an intervention in his hip in the past by Dr. Alvester Morin.  He is certainly interested in seeing Dr. Alvester Morin here for an epidural steroid injection if warranted.  He still has left-sided sciatic symptoms.  When he gets up on the exam table he still seems to be favoring the left side more.  He does have irritation with his lumbar spine into the sciatic region when I perform a straight leg raise on that side.  He does have some subjectively decreased sensation in his foot on the left side as well.  I did go over the MRI with him.  He has multilevel stenosis from L2 and L3 all the way down but he does have a paracentral disc protrusion at L5-S1 to the left side that may be irritating the S1 nerve root but also there is some degenerative changes may be affecting the L5.  This seems to be more correlating with his clinical exam and signs and symptoms then the other levels that do show stenosis.  He is interested in having an intervention by Dr. Alvester Morin.  I feel that this should most likely Vivek of the left L5-S1 level.  We will work on getting that appointment made.  He can then certainly be set up for follow-up with [place] sometime after his intervention by Dr. Alvester Morin.  All questions and concerns were answered and addressed

## 2019-10-26 ENCOUNTER — Other Ambulatory Visit: Payer: Self-pay

## 2019-10-26 DIAGNOSIS — M4807 Spinal stenosis, lumbosacral region: Secondary | ICD-10-CM

## 2019-10-26 DIAGNOSIS — G8929 Other chronic pain: Secondary | ICD-10-CM

## 2019-11-01 ENCOUNTER — Other Ambulatory Visit (INDEPENDENT_AMBULATORY_CARE_PROVIDER_SITE_OTHER): Payer: Self-pay | Admitting: Specialist

## 2019-11-01 NOTE — Telephone Encounter (Signed)
CB patient  

## 2019-11-27 ENCOUNTER — Encounter: Payer: Self-pay | Admitting: Physical Medicine and Rehabilitation

## 2019-11-27 ENCOUNTER — Other Ambulatory Visit: Payer: Self-pay

## 2019-11-27 ENCOUNTER — Ambulatory Visit: Payer: Self-pay

## 2019-11-27 ENCOUNTER — Ambulatory Visit (INDEPENDENT_AMBULATORY_CARE_PROVIDER_SITE_OTHER): Payer: BC Managed Care – PPO | Admitting: Physical Medicine and Rehabilitation

## 2019-11-27 VITALS — BP 176/96 | HR 68

## 2019-11-27 DIAGNOSIS — M5416 Radiculopathy, lumbar region: Secondary | ICD-10-CM | POA: Diagnosis not present

## 2019-11-27 MED ORDER — METHYLPREDNISOLONE ACETATE 80 MG/ML IJ SUSP
80.0000 mg | Freq: Once | INTRAMUSCULAR | Status: AC
  Administered 2019-11-27: 80 mg

## 2019-11-27 NOTE — Progress Notes (Signed)
Eddie Alexander - 59 y.o. male MRN [id number]  Date of birth: 1961/01/22  Office Visit Note: Visit Date: 11/27/2019 PCP: Steele Sizer, MD Referred by: Steele Sizer, MD  Subjective: Chief Complaint  Patient presents with  . Lower Back - Pain  . Left Leg - Pain  . Left Foot - Numbness   HPI:  Eddie Alexander is a 59 y.o. male who comes in today at the request of Dr. Naaman Plummer for planned Left S1-2 Lumbar epidural steroid injection with fluoroscopic guidance.  The patient has failed conservative care including home exercise, medications, time and activity modification.  This injection will be diagnostic and hopefully therapeutic.  Please see requesting physician notes for further details and justification.  Patient also followed by Dr. Doneen Poisson.  Since I last seen him he has had a hip replacement.  He reports last injection performed prior to that which was August 2020 helped a great deal with his back and leg pain.  He is also had an MRI of the lumbar spine updated.  This is reviewed with him today and reviewed below in the notes.  This shows continued significant stenosis which is a combination of congenital and acquired particular at L4-5.  He also has disc protrusion at L5-S1 on the left.  His symptoms are left-sided predominant low back pain with some referral in the hip.  He occasionally gets symptoms into the left lower leg and lateral digits.  More of an S1 distribution if that is all radicular.  Depending on relief with injection today would repeat L4 injection.  Patient may unfortunately be a surgical candidate for lumbar stenosis.  ROS Otherwise per HPI.  Assessment & Plan: Visit Diagnoses:  1. Lumbar radiculopathy     Plan: No additional findings.   Meds & Orders:  Meds ordered this encounter  Medications  . methylPREDNISolone acetate (DEPO-MEDROL) injection 80 mg    Orders Placed This Encounter  Procedures  . XR C-ARM NO REPORT  . Epidural  Steroid injection    Follow-up: Return in about 2 weeks (around 12/11/2019) for Consider L4 transforaminal.   Procedures: No procedures performed  S1 Lumbosacral Transforaminal Epidural Steroid Injection - Sub-Pedicular Approach with Fluoroscopic Guidance   Patient: Eddie Alexander      Date of Birth: 09-Sep-1960 MRN: [id number] PCP: Steele Sizer, MD      Visit Date: 11/27/2019   Universal Protocol:    Date/Time: 06/28/213:04 PM  Consent Given By: the patient  Position:  PRONE  Additional Comments: Vital signs were monitored before and after the procedure. Patient was prepped and draped in the usual sterile fashion. The correct patient, procedure, and site was verified.   Injection Procedure Details:  Procedure Site One Meds Administered:  Meds ordered this encounter  Medications  . methylPREDNISolone acetate (DEPO-MEDROL) injection 80 mg    Laterality: Left  Location/Site:  S1 Foramen   Needle size: 22 ga.  Needle type: Spinal  Needle Placement: Transforaminal  Findings:   -Comments: Excellent flow of contrast along the nerve and into the epidural space.  Epidurogram: Contrast epidurogram showed no nerve root cut off or restricted flow pattern.  Procedure Details: After squaring off the sacral end-plate to get a true AP view, the C-arm was positioned so that the best possible view of the S1 foramen was visualized. The soft tissues overlying this structure were infiltrated with 2-3 ml. of 1% Lidocaine without Epinephrine.    The spinal needle  was inserted toward the target using a "trajectory" view along the fluoroscope beam.  Under AP and lateral visualization, the needle was advanced so it did not puncture dura. Biplanar projections were used to confirm position. Aspiration was confirmed to be negative for CSF and/or blood. A 1-2 ml. volume of Isovue-250 was injected and flow of contrast was noted at each level. Radiographs were obtained for documentation  purposes.   After attaining the desired flow of contrast documented above, a 0.5 to 1.0 ml test dose of 0.25% Marcaine was injected into each respective transforaminal space.  The patient was observed for 90 seconds post injection.  After no sensory deficits were reported, and normal lower extremity motor function was noted,   the above injectate was administered so that equal amounts of the injectate were placed at each foramen (level) into the transforaminal epidural space.   Additional Comments:  The patient tolerated the procedure well Dressing: Band-Aid with 2 x 2 sterile gauze    Post-procedure details: Patient was observed during the procedure. Post-procedure instructions were reviewed.  Patient left the clinic in stable condition.     Clinical History: MRI LUMBAR SPINE WITHOUT CONTRAST  TECHNIQUE: Multiplanar, multisequence MR imaging of the lumbar spine was performed. No intravenous contrast was administered.  COMPARISON:  Lumbar myelogram 03/28/2018  FINDINGS: Segmentation:  Standard lumbar numbering.  Alignment:  Mild levocurvature of the lumbar spine  Vertebrae: Mild discogenic endplate edema at F0-9. No fracture, discitis, or aggressive bone lesion  Conus medullaris and cauda equina: Conus extends to the L1-2 level. A terminal ventricle is noted at the conus. Redundant nerve roots above the L2-3 disc space due to severe stenosis.  Paraspinal and other soft tissues: Slight STIR hyperintensity in intrinsic back muscles on the right that does not correlate with abnormality on axial slices.  Disc levels:  T12- L1: Spondylosis.  No impingement  L1-L2: Disc narrowing and bulging with mild left foraminal narrowing.  L2-L3: Advanced disc narrowing with right-sided collapse and endplate degeneration. There is asymmetric right-sided disc bulging and far-lateral spurring. Facet spurring and ligamentum flavum thickening. Spinal stenosis is advanced.  Right foraminal impingement is high-grade.  L3-L4: Disc narrowing and bulging with posterior element hypertrophy. High-grade spinal stenosis, especially in the subarticular recesses. The foramina are patent.  L4-L5: Disc narrowing and bulging with posterior element hypertrophy. High-grade spinal stenosis with bilateral subarticular recess effacement. Moderate bilateral foraminal impingement  L5-S1:Disc narrowing and bulging with a left paracentral protrusion posteriorly displacing but not compressing the left S1 nerve root. Degenerative facet spurring. Foramina are patent with mild stenosis on the left. A bulky left far-lateral endplate spur contacts the L5 nerve root.  IMPRESSION: 1. 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 2. L2-3 advanced spinal stenosis. 3. L3-4 and L4-5 high-grade spinal stenosis, especially at the bilateral subarticular recesses. 4. Multilevel foraminal narrowing greatest at L4-5.   Electronically Signed   By: Monte Fantasia M.D.   On: 10/21/2019 13:29 --- CTL CT-Myelogram  IMPRESSION:  CERVICAL: Status post C5-C7 ACDF. No adverse features. Asymmetric  foraminal narrowing at C3-4 on the LEFT could affect the LEFT C4  nerve root. No significant adjacent segment disease at the C4-5  level. No significant spinal stenosis or cord compression.    THORACIC: Unremarkable thoracic myelogram and postmyelogram CT.    LUMBAR: Congenital and acquired stenosis at multiple levels, worst  at L2-3 and L4-5. With regard  to foot drop, potentially symptomatic  RIGHT L5 neural impingement could occur at the L4-5 level due to  subarticular zone narrowing, or at L5-S1 due to foraminal narrowing.      Electronically Signed  By: Elsie Stain M.D.  On: 03/28/2018 12:57     Objective:  VS:  HT:    WT:   BMI:      BP:(!) 176/96  HR:68bpm  TEMP: ( )  RESP:  Physical Exam Constitutional:      General: He is not in acute distress.    Appearance: Normal appearance. He is not ill-appearing.  HENT:     Head: Normocephalic and atraumatic.     Right Ear: External ear normal.     Left Ear: External ear normal.  Eyes:     Extraocular Movements: Extraocular movements intact.  Cardiovascular:     Rate and Rhythm: Normal rate.     Pulses: Normal pulses.  Abdominal:     General: There is no distension.     Palpations: Abdomen is soft.  Musculoskeletal:        General: No tenderness or signs of injury.     Right lower leg: No edema.     Left lower leg: No edema.     Comments: Ambulates without aid with weakness in left  DF/PF.  Skin:    Findings: No erythema or rash.  Neurological:     General: No focal deficit present.     Mental Status: He is alert and oriented to person, place, and time.     Sensory: No sensory deficit.     Motor: No weakness or abnormal muscle tone.     Coordination: Coordination normal.  Psychiatric:        Mood and Affect: Mood normal.        Behavior: Behavior normal.      Imaging: XR C-ARM NO REPORT  Result Date: 11/27/2019 Please see Notes tab for imaging impression.

## 2019-11-27 NOTE — Procedures (Signed)
S1 Lumbosacral Transforaminal Epidural Steroid Injection - Sub-Pedicular Approach with Fluoroscopic Guidance   Patient: Eddie Alexander      Date of Birth: 1960-08-15 MRN: [id number] PCP: Steele Sizer, MD      Visit Date: 11/27/2019   Universal Protocol:    Date/Time: 06/28/213:04 PM  Consent Given By: the patient  Position:  PRONE  Additional Comments: Vital signs were monitored before and after the procedure. Patient was prepped and draped in the usual sterile fashion. The correct patient, procedure, and site was verified.   Injection Procedure Details:  Procedure Site One Meds Administered:  Meds ordered this encounter  Medications  . methylPREDNISolone acetate (DEPO-MEDROL) injection 80 mg    Laterality: Left  Location/Site:  S1 Foramen   Needle size: 22 ga.  Needle type: Spinal  Needle Placement: Transforaminal  Findings:   -Comments: Excellent flow of contrast along the nerve and into the epidural space.  Epidurogram: Contrast epidurogram showed no nerve root cut off or restricted flow pattern.  Procedure Details: After squaring off the sacral end-plate to get a true AP view, the C-arm was positioned so that the best possible view of the S1 foramen was visualized. The soft tissues overlying this structure were infiltrated with 2-3 ml. of 1% Lidocaine without Epinephrine.    The spinal needle was inserted toward the target using a "trajectory" view along the fluoroscope beam.  Under AP and lateral visualization, the needle was advanced so it did not puncture dura. Biplanar projections were used to confirm position. Aspiration was confirmed to be negative for CSF and/or blood. A 1-2 ml. volume of Isovue-250 was injected and flow of contrast was noted at each level. Radiographs were obtained for documentation purposes.   After attaining the desired flow of contrast documented above, a 0.5 to 1.0 ml test dose of 0.25% Marcaine was injected into each  respective transforaminal space.  The patient was observed for 90 seconds post injection.  After no sensory deficits were reported, and normal lower extremity motor function was noted,   the above injectate was administered so that equal amounts of the injectate were placed at each foramen (level) into the transforaminal epidural space.   Additional Comments:  The patient tolerated the procedure well Dressing: Band-Aid with 2 x 2 sterile gauze    Post-procedure details: Patient was observed during the procedure. Post-procedure instructions were reviewed.  Patient left the clinic in stable condition.

## 2019-11-27 NOTE — Progress Notes (Signed)
Pt states pain in the lower back on the left side and radiates into the left leg all the way down. Pt also states numbness in the left foot. Pt states pain started post surgery and last injection 01/12/2019 helped out a lot. Walking with a cane helps with pain. Standing on feet makes pain worse.   .Numeric Pain Rating Scale and Functional Assessment Average Pain 4   In the last MONTH (on 0-10 scale) has pain interfered with the following?  1. General activity like being  able to carry out your everyday physical activities such as walking, climbing stairs, carrying groceries, or moving a chair?  Rating(7)   +Driver, +BT(plavix, ok for inj), -Dye Allergies.

## 2019-12-13 ENCOUNTER — Encounter: Payer: Self-pay | Admitting: Physical Medicine and Rehabilitation

## 2019-12-13 ENCOUNTER — Other Ambulatory Visit: Payer: Self-pay

## 2019-12-13 ENCOUNTER — Ambulatory Visit: Payer: Self-pay

## 2019-12-13 ENCOUNTER — Ambulatory Visit (INDEPENDENT_AMBULATORY_CARE_PROVIDER_SITE_OTHER): Payer: BC Managed Care – PPO | Admitting: Physical Medicine and Rehabilitation

## 2019-12-13 VITALS — BP 186/98 | HR 64

## 2019-12-13 DIAGNOSIS — M5416 Radiculopathy, lumbar region: Secondary | ICD-10-CM | POA: Diagnosis not present

## 2019-12-13 DIAGNOSIS — M48062 Spinal stenosis, lumbar region with neurogenic claudication: Secondary | ICD-10-CM | POA: Diagnosis not present

## 2019-12-13 MED ORDER — METHYLPREDNISOLONE ACETATE 80 MG/ML IJ SUSP
40.0000 mg | Freq: Once | INTRAMUSCULAR | Status: AC
  Administered 2019-12-13: 40 mg

## 2019-12-13 NOTE — Progress Notes (Signed)
Pt states lower back pain that travel down his left leg to his pinky toe. Pt state last inj helped.     Numeric Pain Rating Scale and Functional Assessment Average Pain 3   In the last MONTH (on 0-10 scale) has pain interfered with the following?  1. General activity like being  able to carry out your everyday physical activities such as walking, climbing stairs, carrying groceries, or moving a chair?  Rating(6)   +Driver, +BT, -Dye Allergies.

## 2019-12-20 ENCOUNTER — Ambulatory Visit: Payer: BC Managed Care – PPO | Admitting: Orthopaedic Surgery

## 2020-01-08 NOTE — Procedures (Signed)
Lumbosacral Transforaminal Epidural Steroid Injection - Sub-Pedicular Approach with Fluoroscopic Guidance  Patient: Eddie Alexander      Date of Birth: May 25, 1961 MRN: [id number] PCP: Steele Sizer, MD      Visit Date: 12/13/2019   Universal Protocol:    Date/Time: 12/13/2019  Consent Given By: the patient  Position: PRONE  Additional Comments: Vital signs were monitored before and after the procedure. Patient was prepped and draped in the usual sterile fashion. The correct patient, procedure, and site was verified.   Injection Procedure Details:  Procedure Site One Meds Administered:  Meds ordered this encounter  Medications   methylPREDNISolone acetate (DEPO-MEDROL) injection 40 mg    Laterality: Left  Location/Site:  L4-L5  Needle size: 22 G  Needle type: Spinal  Needle Placement: Transforaminal  Findings:    -Comments: Excellent flow of contrast along the nerve, nerve root and into the epidural space.  Procedure Details: After squaring off the end-plates to get a true AP view, the C-arm was positioned so that an oblique view of the foramen as noted above was visualized. The target area is just inferior to the "nose of the scotty dog" or sub pedicular. The soft tissues overlying this structure were infiltrated with 2-3 ml. of 1% Lidocaine without Epinephrine.  The spinal needle was inserted toward the target using a "trajectory" view along the fluoroscope beam.  Under AP and lateral visualization, the needle was advanced so it did not puncture dura and was located close the 6 O'Clock position of the pedical in AP tracterory. Biplanar projections were used to confirm position. Aspiration was confirmed to be negative for CSF and/or blood. A 1-2 ml. volume of Isovue-250 was injected and flow of contrast was noted at each level. Radiographs were obtained for documentation purposes.   After attaining the desired flow of contrast documented above, a 0.5 to 1.0 ml  test dose of 0.25% Marcaine was injected into each respective transforaminal space.  The patient was observed for 90 seconds post injection.  After no sensory deficits were reported, and normal lower extremity motor function was noted,   the above injectate was administered so that equal amounts of the injectate were placed at each foramen (level) into the transforaminal epidural space.   Additional Comments:  The patient tolerated the procedure well Dressing: 2 x 2 sterile gauze and Band-Aid    Post-procedure details: Patient was observed during the procedure. Post-procedure instructions were reviewed.  Patient left the clinic in stable condition.

## 2020-01-08 NOTE — Progress Notes (Signed)
Eddie Alexander - 59 y.o. male MRN [id number]  Date of birth: [birth year]  Office Visit Note: Visit Date: 12/13/2019 PCP: Steele Sizer, MD Referred by: Steele Sizer, MD  Subjective: Chief Complaint  Patient presents with   Lower Back - Pain   HPI:  Espn Zeman is a 59 y.o. male who comes in today at the request of Dr. Doneen Poisson for planned Left L4-L5 Lumbar epidural steroid injection with fluoroscopic guidance.  The patient has failed conservative care including home exercise, medications, time and activity modification.  This injection will be diagnostic and hopefully therapeutic.  Please see requesting physician notes for further details and justification.   ROS Otherwise per HPI.  Assessment & Plan: Visit Diagnoses:  1. Lumbar radiculopathy   2. Spinal stenosis of lumbar region with neurogenic claudication     Plan: No additional findings.   Meds & Orders:  Meds ordered this encounter  Medications   methylPREDNISolone acetate (DEPO-MEDROL) injection 40 mg    Orders Placed This Encounter  Procedures   XR C-ARM NO REPORT   Epidural Steroid injection    Follow-up: Return if symptoms worsen or fail to improve.   Procedures: No procedures performed  Lumbosacral Transforaminal Epidural Steroid Injection - Sub-Pedicular Approach with Fluoroscopic Guidance  Patient: Loris Winrow      Date of Birth: Jul 18, 1960 MRN: [id number] PCP: Steele Sizer, MD      Visit Date: 12/13/2019   Universal Protocol:    Date/Time: 12/13/2019  Consent Given By: the patient  Position: PRONE  Additional Comments: Vital signs were monitored before and after the procedure. Patient was prepped and draped in the usual sterile fashion. The correct patient, procedure, and site was verified.   Injection Procedure Details:  Procedure Site One Meds Administered:  Meds ordered this encounter  Medications   methylPREDNISolone acetate (DEPO-MEDROL)  injection 40 mg    Laterality: Left  Location/Site:  L4-L5  Needle size: 22 G  Needle type: Spinal  Needle Placement: Transforaminal  Findings:    -Comments: Excellent flow of contrast along the nerve, nerve root and into the epidural space.  Procedure Details: After squaring off the end-plates to get a true AP view, the C-arm was positioned so that an oblique view of the foramen as noted above was visualized. The target area is just inferior to the "nose of the scotty dog" or sub pedicular. The soft tissues overlying this structure were infiltrated with 2-3 ml. of 1% Lidocaine without Epinephrine.  The spinal needle was inserted toward the target using a "trajectory" view along the fluoroscope beam.  Under AP and lateral visualization, the needle was advanced so it did not puncture dura and was located close the 6 O'Clock position of the pedical in AP tracterory. Biplanar projections were used to confirm position. Aspiration was confirmed to be negative for CSF and/or blood. A 1-2 ml. volume of Isovue-250 was injected and flow of contrast was noted at each level. Radiographs were obtained for documentation purposes.   After attaining the desired flow of contrast documented above, a 0.5 to 1.0 ml test dose of 0.25% Marcaine was injected into each respective transforaminal space.  The patient was observed for 90 seconds post injection.  After no sensory deficits were reported, and normal lower extremity motor function was noted,   the above injectate was administered so that equal amounts of the injectate were placed at each foramen (level) into the transforaminal epidural space.  Additional Comments:  The patient tolerated the procedure well Dressing: 2 x 2 sterile gauze and Band-Aid    Post-procedure details: Patient was observed during the procedure. Post-procedure instructions were reviewed.  Patient left the clinic in stable condition.      Clinical History: MRI LUMBAR  SPINE WITHOUT CONTRAST  TECHNIQUE: Multiplanar, multisequence MR imaging of the lumbar spine was performed. No intravenous contrast was administered.  COMPARISON:  Lumbar myelogram 03/28/2018  FINDINGS: Segmentation:  Standard lumbar numbering.  Alignment:  Mild levocurvature of the lumbar spine  Vertebrae: Mild discogenic endplate edema at U2-0. No fracture, discitis, or aggressive bone lesion  Conus medullaris and cauda equina: Conus extends to the L1-2 level. A terminal ventricle is noted at the conus. Redundant nerve roots above the L2-3 disc space due to severe stenosis.  Paraspinal and other soft tissues: Slight STIR hyperintensity in intrinsic back muscles on the right that does not correlate with abnormality on axial slices.  Disc levels:  T12- L1: Spondylosis.  No impingement  L1-L2: Disc narrowing and bulging with mild left foraminal narrowing.  L2-L3: Advanced disc narrowing with right-sided collapse and endplate degeneration. There is asymmetric right-sided disc bulging and far-lateral spurring. Facet spurring and ligamentum flavum thickening. Spinal stenosis is advanced. Right foraminal impingement is high-grade.  L3-L4: Disc narrowing and bulging with posterior element hypertrophy. High-grade spinal stenosis, especially in the subarticular recesses. The foramina are patent.  L4-L5: Disc narrowing and bulging with posterior element hypertrophy. High-grade spinal stenosis with bilateral subarticular recess effacement. Moderate bilateral foraminal impingement  L5-S1:Disc narrowing and bulging with a left paracentral protrusion posteriorly displacing but not compressing the left S1 nerve root. Degenerative facet spurring. Foramina are patent with mild stenosis on the left. A bulky left far-lateral endplate spur contacts the L5 nerve root.  IMPRESSION: 1. 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 2. L2-3 advanced spinal stenosis. 3. L3-4 and L4-5 high-grade spinal stenosis, especially at the bilateral subarticular recesses. 4. Multilevel foraminal narrowing greatest at L4-5.   Electronically Signed   By: Marnee Spring M.D.   On: 10/21/2019 13:29 --- CTL CT-Myelogram  IMPRESSION:  CERVICAL: Status post C5-C7 ACDF. No adverse features. Asymmetric  foraminal narrowing at C3-4 on the LEFT could affect the LEFT C4  nerve root. No significant adjacent segment disease at the C4-5  level. No significant spinal stenosis or cord compression.    THORACIC: Unremarkable thoracic myelogram and postmyelogram CT.    LUMBAR: Congenital and acquired stenosis at multiple levels, worst  at L2-3 and L4-5. With regard to foot drop, potentially symptomatic  RIGHT L5 neural impingement could occur at the L4-5 level due to  subarticular zone narrowing, or at L5-S1 due to foraminal narrowing.      Electronically Signed  By: Elsie Stain M.D.  On: 03/28/2018 12:57     Objective:  VS:  HT:     WT:    BMI:      BP:(!) 186/98   HR:64bpm   TEMP: ( )   RESP:  Physical Exam Constitutional:      General: He is not in acute distress.    Appearance: Normal appearance. He is not ill-appearing.  HENT:     Head: Normocephalic and atraumatic.     Right Ear: External ear normal.     Left Ear: External ear normal.  Eyes:     Extraocular Movements: Extraocular movements intact.  Cardiovascular:     Rate and Rhythm: Normal rate.     Pulses: Normal pulses.  Abdominal:     General: There is no distension.     Palpations: Abdomen is soft.  Musculoskeletal:        General: No tenderness or signs of injury.     Right lower leg: No edema.     Left lower leg: No edema.     Comments: Patient has good distal strength without clonus.  Skin:    Findings: No erythema or rash.  Neurological:      General: No focal deficit present.     Mental Status: He is alert and oriented to person, place, and time.     Sensory: No sensory deficit.     Motor: No weakness or abnormal muscle tone.     Coordination: Coordination normal.  Psychiatric:        Mood and Affect: Mood normal.        Behavior: Behavior normal.      Imaging: No results found.

## 2020-04-26 ENCOUNTER — Other Ambulatory Visit (INDEPENDENT_AMBULATORY_CARE_PROVIDER_SITE_OTHER): Payer: Self-pay | Admitting: Orthopaedic Surgery

## 2020-11-15 IMAGING — DX DG PORTABLE PELVIS
1 series · 1 of 1 positions shown · non-contrast
Comparison: None.

CLINICAL DATA: Status post left hip replacement

EXAM:
PORTABLE PELVIS 1-2 VIEWS

[pelvis]
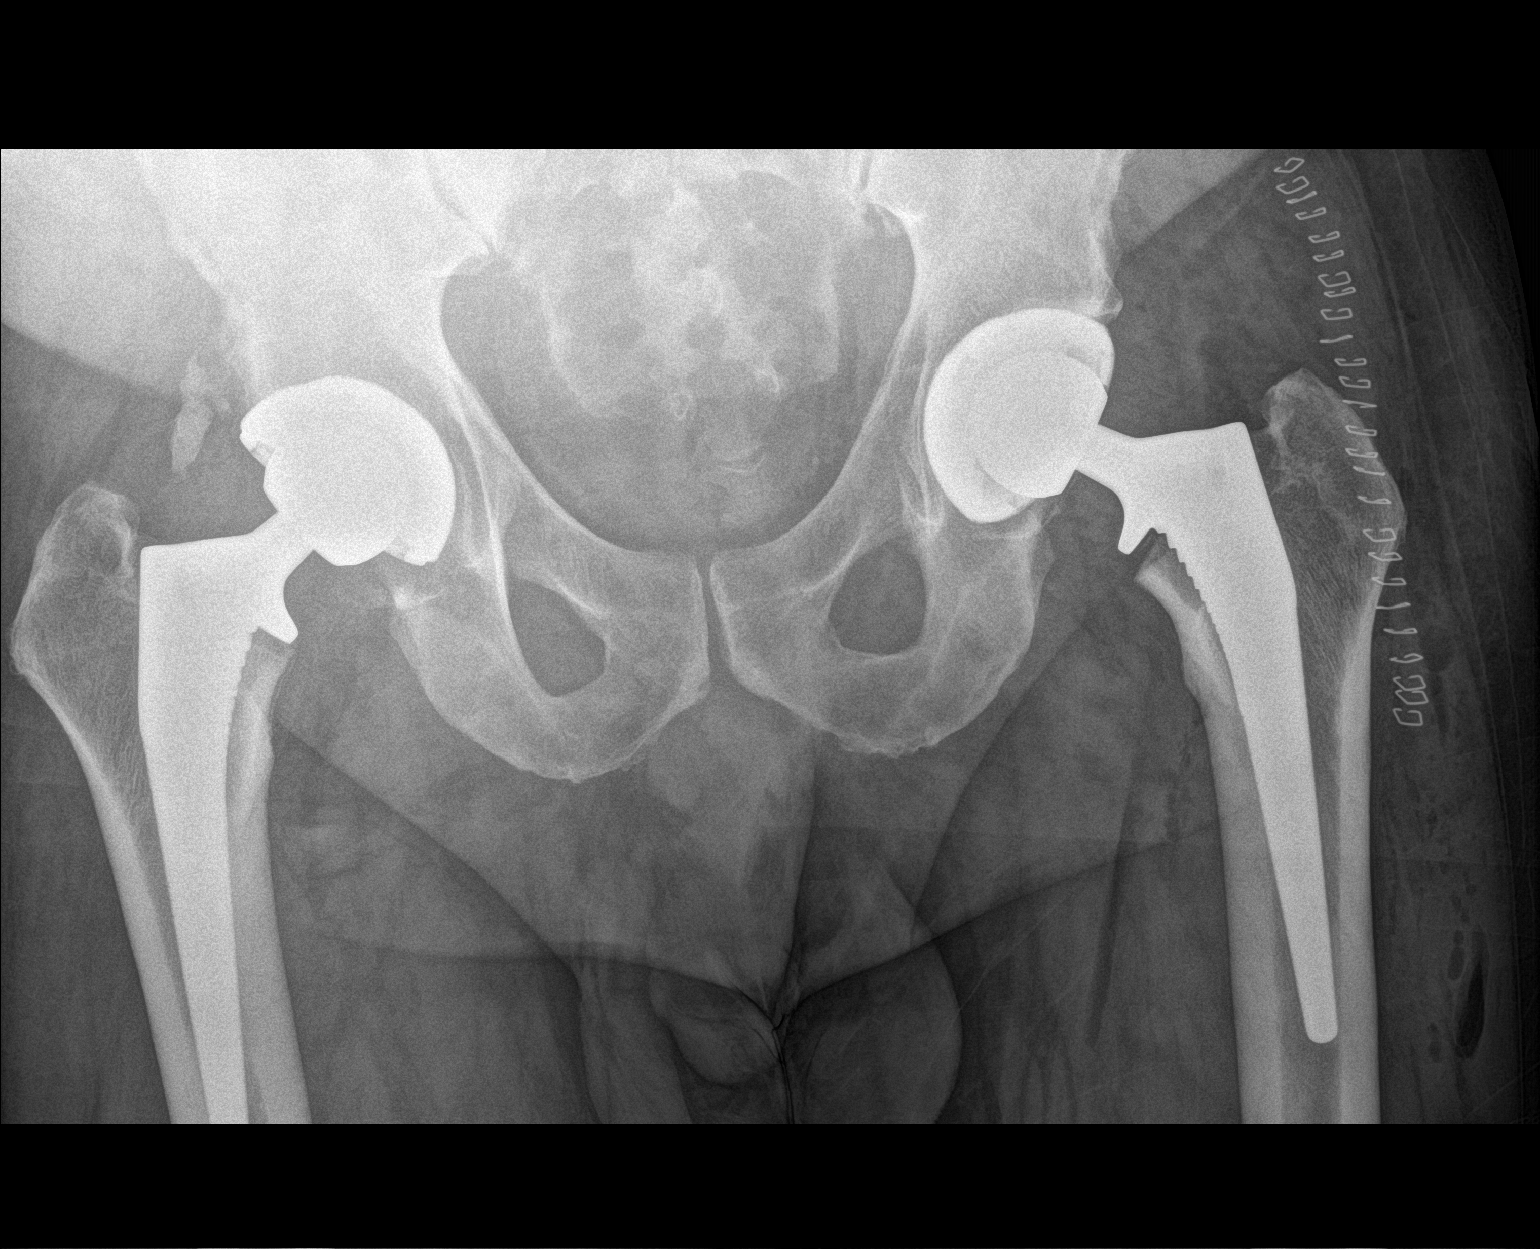

[1 of 1 positions shown; findings below may reference images not displayed]

FINDINGS: The patient is status post left hip replacement. Hardware is in good
position. Skin staples and postoperative soft tissue air are
identified.
IMPRESSION: Left hip replacement as above.

## 2020-11-15 IMAGING — RF DG HIP (WITH PELVIS) OPERATIVE*L*
1 series · 5 of 5 positions shown · non-contrast
Comparison: None.

CLINICAL DATA: Left total hip arthroplasty.

EXAM:
DG C-ARM 1-60 MIN; OPERATIVE LEFT HIP WITH PELVIS
FLUOROSCOPY TIME:  Fluoroscopy Time:  19 seconds

[Series 1: unknown protocol · 0.20mm/px · 5 of 5 slices shown]
[im 1/5]
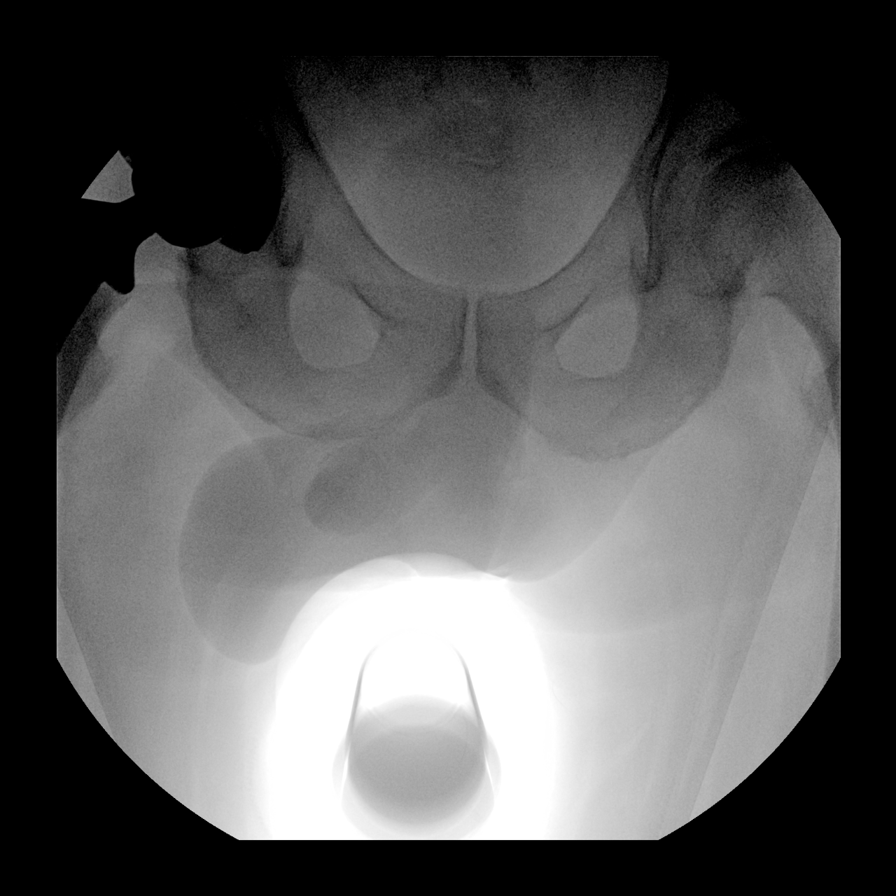
[im 2/5]
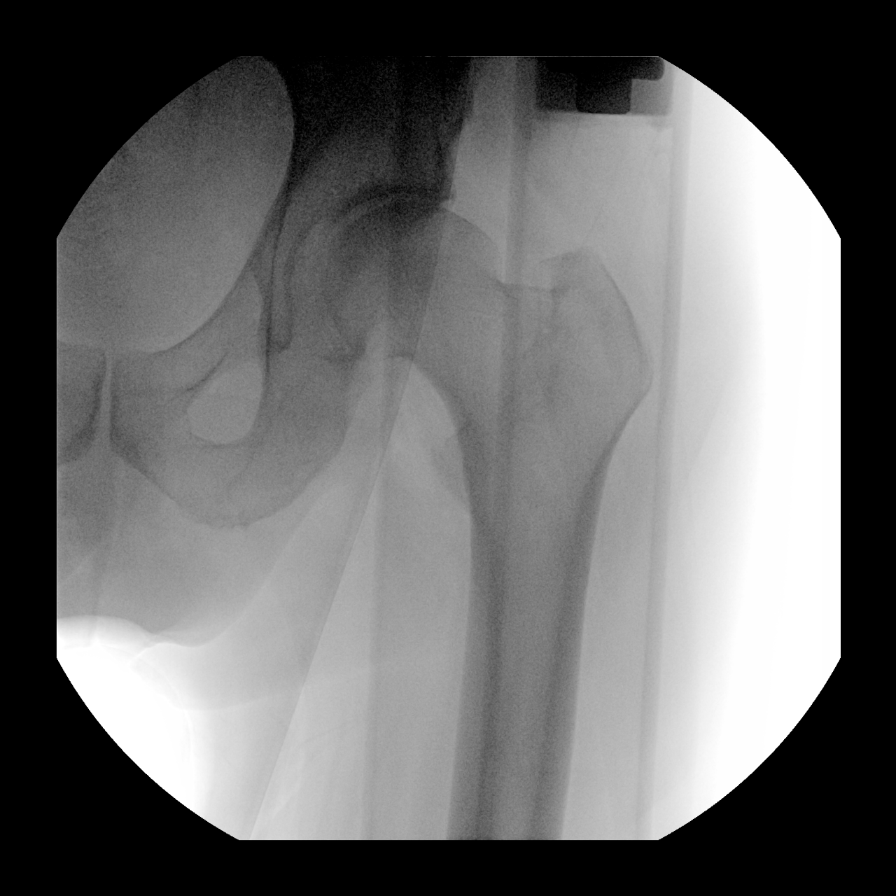
[im 3/5]
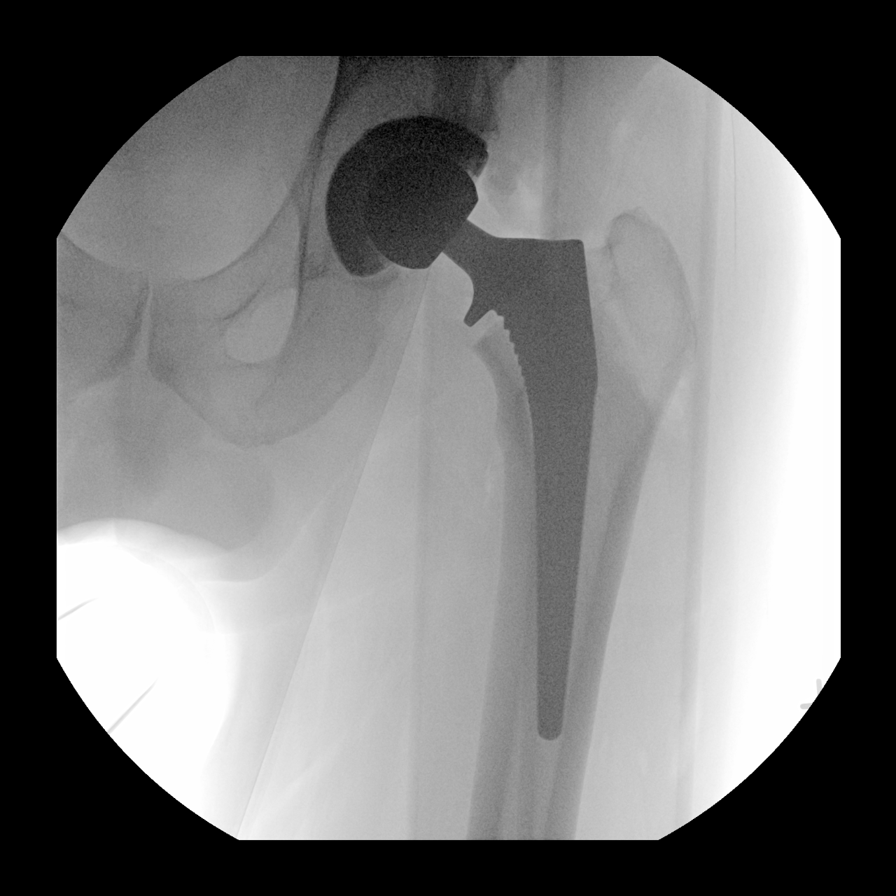
[im 4/5]
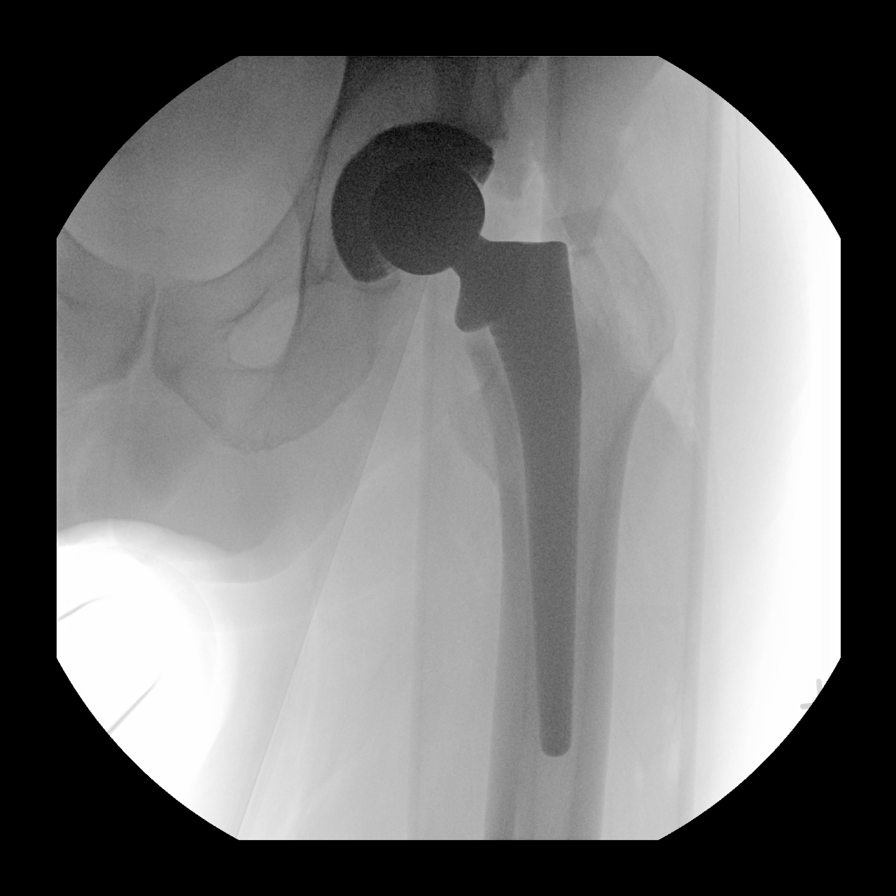
[im 5/5]
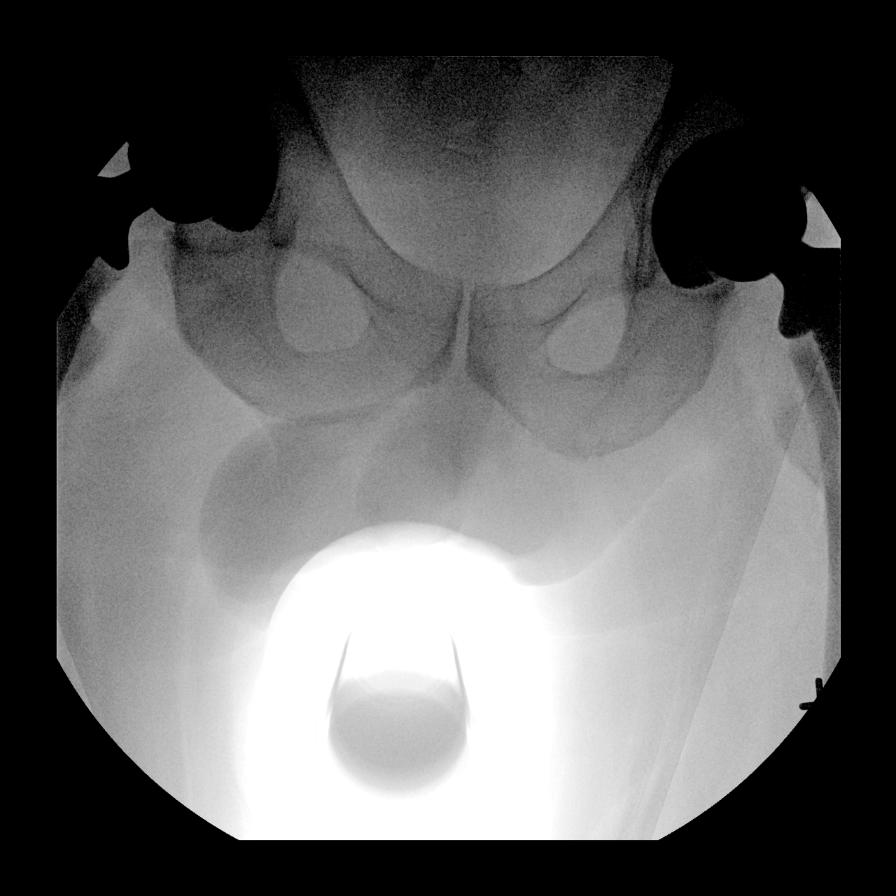

[5 of 5 positions shown; findings below may reference images not displayed]

FINDINGS: Interval left total hip arthroplasty.  Normal alignment.

Prior right total hip arthroplasty.
IMPRESSION: Interval left total hip arthroplasty.

## 2021-09-03 ENCOUNTER — Encounter: Payer: BC Managed Care – PPO | Attending: Internal Medicine | Admitting: Dietician

## 2021-09-03 ENCOUNTER — Encounter: Payer: Self-pay | Admitting: Dietician

## 2021-09-03 VITALS — Ht 71.0 in | Wt 258.8 lb

## 2021-09-03 DIAGNOSIS — Z6836 Body mass index (BMI) 36.0-36.9, adult: Secondary | ICD-10-CM

## 2021-09-03 DIAGNOSIS — E785 Hyperlipidemia, unspecified: Secondary | ICD-10-CM

## 2021-09-03 DIAGNOSIS — I10 Essential (primary) hypertension: Secondary | ICD-10-CM | POA: Diagnosis not present

## 2021-09-03 DIAGNOSIS — E119 Type 2 diabetes mellitus without complications: Secondary | ICD-10-CM | POA: Diagnosis not present

## 2021-09-03 DIAGNOSIS — E669 Obesity, unspecified: Secondary | ICD-10-CM

## 2021-09-03 NOTE — Patient Instructions (Addendum)
Plan balanced meals by including each food group -- protein + controlled portion of starch + low carb veg. Limiting fat and sugar will also help control calories. ?Calorie goal for weight loss is 1800-1900 calories daily. ?Can check into diabetes meal options at [email or web address], or diabeticcooking ?Continue with mostly healthy food choices and regular exercise, great job! ?

## 2021-09-03 NOTE — Progress Notes (Signed)
Medical Nutrition Therapy: Visit start time: 1330  end time: 1430  ?Assessment:  Diagnosis: Type 2 Diabetes ?Past medical history: HTN, CHF, hyperlipidemia, spinal stenosis ?Psychosocial issues/ stress concerns: none ? ?Preferred learning method:  ?Auditory ?Hands-on ? ? ?Current weight: 258.8lbs Height: 5'11"  BMI: 36.1 ? ?Medications, supplements: reconciled list in medical record ? ?Progress and evaluation:  ?Recent HbA1C 7.1%, total cholesterol 102, LDL 25, HDL 39.8, triglycerides 187 (07/15/21) ?Patient reports BGs ranging 130-194 (highest after eating). Usually checks fasitng, reports significant improvement.  ?Has switched to diet drinks, more water, less soda ?Increased salad meals ?Participated in clinical trial at [facility] for exercise, lost 7lbs and he had earlier lost several pounds during pariticipation in cardiac rehab program  ? ?Physical activity: walking 3x a week 30 minutes ? ?Dietary Intake:  ?Usual eating pattern includes 2-3 meals and 2-3 snacks per day. ?Dining out frequency: 3 meals per week. ? ?Breakfast: eggs and toast occ with bacon; Special K with almond milk; pbj toast ?Snack: none or same as pm ?Lunch: salad with tuna/ salmon/ grilled chicken, sometimes later pm due to lack of hunger ?Snack: nabs; peanuts; pb crackers ?Supper: burger with cheese, veg, mayo, ketchups and mustard, few chips; if late lunch, meat and cheese rollup; cheese and crackers and glass of wine; salad with protein same as pm uses  sugar free dressing ie rasp vinaigrette ?Snack: something sweet ?Beverages: water, diet soda, plans to make tea with stevia ? ?Intervention:  ? ?Nutrition Care Education: ?  ?Basic nutrition: basic food groups; appropriate nutrient balance; appropriate meal and snack schedule; general nutrition guidelines    ?Weight control: importance of low sugar and low fat choices; portion control; estimated energy needs for weight loss at 1900kcal, provided guidance for 45%CHO, 25% pro, 30% fat ?Diabetes:   goals for BGs, appropriate meal and snack schedule, appropriate carb intake and balance, healthy carb choices, role of fiber, protein, fat; role of physical activity ? ? ? ?Nutritional Diagnosis:  [place]-2.2 Altered nutrition-related laboratory As related to Type 2 diabetes.  As evidenced by elevated HbA1C. ?[place]-3.3 Overweight/obesity As related to history of excess calories, inadequate physical activity.  As evidenced by current BMI of 36, making diet and lifestyle changes to promote ongoing weight loss. ? ? ?Education Materials given:  ?General diet guidelines for Diabetes ?Plate Planner with food lists, sample meal pattern ?Diabetes and You [employer]) ?Meal Planning and Carb Counting booklet ([employer]) ?Visit summary with goals/ instructions ? ? ?Learner/ who was taught:  ?Patient  ? ?Level of understanding: ?Verbalizes/ demonstrates competency ? ? ?Demonstrated degree of understanding via:   Teach back ?Learning barriers: ?None ? ?Willingness to learn/ readiness for change: ?Eager, change in progress ? ? ?Monitoring and Evaluation:  Dietary intake, exercise, BG control, and body weight ?     follow up: prn  ?

## 2022-10-07 ENCOUNTER — Encounter: Payer: Self-pay | Admitting: Gastroenterology

## 2022-10-07 NOTE — H&P (Signed)
Pre-Procedure H&P   Patient ID: Eddie Alexander is a 62 y.o. male.  Gastroenterology Provider: Jaynie Collins, DO  Referring Provider: Fransico Setters, NP PCP: Lynnea Ferrier, MD  Date: 10/08/2022  HPI [name] is a 62 y.o. male who presents today for Colonoscopy for Colorectal cancer screening .  Patient reports regular bowel Marj without melena hematochezia diarrhea or constipation.  No family history of colon cancer or colon polyps.  He has held his Rybelsus (last dose 5/6) and his Plavix (last dose 5/3)  Hemoglobin 12.7 MCV 86.7 platelets 170,000 creatinine 1.1  S/p right and left hip replacement   Past Medical History:  Diagnosis Date   Arthritis    Back pain    CAD (coronary artery disease)    Cervical spondylosis with myelopathy    Diabetes mellitus without complication (HCC)    GERD (gastroesophageal reflux disease)    H/O urinary frequency    Hyperlipidemia    Hypertension    Myocardial infarction [facility]) 2011   caused VF arresr; s/p LAD bare metal stent; Sees Dr Romeo Apple @ [facility] annualyy s/p stent    Past Surgical History:  Procedure Laterality Date   ANTERIOR CERVICAL DECOMP/DISCECTOMY FUSION N/A 01/08/2014   Procedure: ANTERIOR CERVICAL DECOMPRESSION/DISCECTOMY FUSION 2 LEVELS;  Surgeon: Tressie Stalker, MD;  Location: [facility] NEURO ORS;  Service: Neurosurgery;  Laterality: N/A;  Cervical Five-Six/Six-Seven Anterior Cervical Decompression with Fusoin interbody prosthesis plating and bonegraft   CORONARY ANGIOPLASTY  2011,2013   Dr Bernette Redbird at [facility]   TONSILLECTOMY     TOTAL HIP ARTHROPLASTY Right 05/10/2018   TOTAL HIP ARTHROPLASTY Right 05/10/2018   Procedure: RIGHT TOTAL HIP ARTHROPLASTY ANTERIOR APPROACH;  Surgeon: Kathryne Hitch, MD;  Location[facility] OR;  Service: Orthopedics;  Laterality: Right;   TOTAL HIP ARTHROPLASTY Left 05/09/2019   Procedure: LEFT TOTAL HIP ARTHROPLASTY ANTERIOR APPROACH;  Surgeon: Kathryne Hitch,  MD;  Location: [facility] OR;  Service: Orthopedics;  Laterality: Left;   WISDOM TOOTH EXTRACTION      Family History No h/o GI disease or malignancy  Review of Systems  Constitutional:  Negative for activity change, appetite change, chills, diaphoresis, fatigue, fever and unexpected weight change.  HENT:  Negative for trouble swallowing and voice change.   Respiratory:  Negative for shortness of breath and wheezing.   Cardiovascular:  Negative for chest pain, palpitations and leg swelling.  Gastrointestinal:  Negative for abdominal distention, abdominal pain, anal bleeding, blood in stool, constipation, diarrhea, nausea and vomiting.  Musculoskeletal:  Negative for arthralgias and myalgias.  Skin:  Negative for color change and pallor.  Neurological:  Negative for dizziness, syncope and weakness.  Psychiatric/Behavioral:  Negative for confusion. The patient is not nervous/anxious.   All other systems reviewed and are negative.    Medications No current facility-administered medications on file prior to encounter.   Current Outpatient Medications on File Prior to Encounter  Medication Sig Dispense Refill   aspirin EC 81 MG tablet Take 81 mg by mouth daily.     atorvastatin (LIPITOR) 20 MG tablet Take 1 tablet by mouth daily.     carvedilol (COREG) 25 MG tablet Take 25 mg by mouth 2 (two) times daily.     chlorthalidone (HYGROTON) 25 MG tablet Take 1 tablet by mouth daily.     lisinopril (PRINIVIL,ZESTRIL) 40 MG tablet Take 40 mg by mouth daily.     Semaglutide 14 MG TABS Take 14 mg by mouth daily.  Cholecalciferol (VITAMIN D3) 50 MCG (2000 UT) TABS Take 2,000 Units by mouth daily. (Patient not taking: Reported on 09/03/2021)     clopidogrel (PLAVIX) 75 MG tablet Take 75 mg by mouth daily.     diazepam (VALIUM) 5 MG tablet Take 1 tablet (5 mg total) by mouth daily as needed for anxiety. 30 tablet 0   docusate sodium (COLACE) 100 MG capsule Take 1 capsule (100 mg total) by mouth 2 (two)  times daily. 10 capsule 0   ibuprofen (ADVIL) 200 MG tablet Take by mouth.     metFORMIN (GLUCOPHAGE-XR) 500 MG 24 hr tablet Take by mouth.     methocarbamol (ROBAXIN) 500 MG tablet Take 1 tablet (500 mg total) by mouth every 6 (six) hours as needed for muscle spasms. 40 tablet 1   Multiple Vitamins-Minerals (CENTRUM SILVER) CHEW Chew 1 tablet by mouth daily. (Patient not taking: Reported on 09/03/2021)     Omega-3 Fatty Acids (FISH OIL) 1200 MG CAPS Take 1,200 mg by mouth 3 (three) times a week.  (Patient not taking: Reported on 09/03/2021)     oxyCODONE (ROXICODONE) 5 MG immediate release tablet Take 1-2 tablets (5-10 mg total) by mouth every 6 (six) hours as needed for severe pain. 30 tablet 0   sildenafil (VIAGRA) 50 MG tablet Take by mouth.     Soft Lens Products (REWETTING DROPS) SOLN Place 1 drop into both eyes as needed (dry contact lenses.).      Pertinent medications related to GI and procedure were reviewed by me with the patient prior to the procedure   Current Facility-Administered Medications:    0.9 %  sodium chloride infusion, , Intravenous, Continuous, Jaynie Collins, DO, Last Rate: 20 mL/hr at 10/08/22 0730, New Bag at 10/08/22 0730  sodium chloride 20 mL/hr at 10/08/22 0730       No Known Allergies Allergies were reviewed by me prior to the procedure  Objective   Body mass index is 34.59 kg/m. Vitals:   10/08/22 0708  BP: 129/87  Pulse: 81  Resp: 16  Temp: (!) 97.3 F (36.3 C)  TempSrc: Temporal  SpO2: 100%  Weight: 112.5 kg  Height: 5\' 11"  (1.803 m)     Physical Exam Vitals and nursing note reviewed.  Constitutional:      General: He is not in acute distress.    Appearance: Normal appearance. He is obese. He is not ill-appearing, toxic-appearing or diaphoretic.  HENT:     Head: Normocephalic and atraumatic.     Nose: Nose normal.     Mouth/Throat:     Mouth: Mucous membranes are moist.     Pharynx: Oropharynx is clear.  Eyes:     General: No  scleral icterus.    Extraocular Movements: Extraocular movements intact.  Cardiovascular:     Rate and Rhythm: Normal rate and regular rhythm.     Heart sounds: Normal heart sounds. No murmur heard.    No friction rub. No gallop.  Pulmonary:     Effort: Pulmonary effort is normal. No respiratory distress.     Breath sounds: Normal breath sounds. No wheezing, rhonchi or rales.  Abdominal:     General: Bowel sounds are normal. There is no distension.     Palpations: Abdomen is soft.     Tenderness: There is no abdominal tenderness. There is no guarding or rebound.  Musculoskeletal:     Cervical back: Neck supple.     Right lower leg: No edema.     Left  lower leg: No edema.  Skin:    General: Skin is warm and dry.     Coloration: Skin is not jaundiced or pale.  Neurological:     General: No focal deficit present.     Mental Status: He is alert and oriented to person, place, and time. Mental status is at baseline.  Psychiatric:        Mood and Affect: Mood normal.        Behavior: Behavior normal.        Thought Content: Thought content normal.        Judgment: Judgment normal.      Assessment:  [name] is a 62 y.o. male  who presents today for Colonoscopy for Colorectal cancer screening .  Plan:  Colonoscopy with possible intervention today  Colonoscopy with possible biopsy, control of bleeding, polypectomy, and interventions as necessary has been discussed with the patient/patient representative. Informed consent was obtained from the patient/patient representative after explaining the indication, nature, and risks of the procedure including but not limited to death, bleeding, perforation, missed neoplasm/lesions, cardiorespiratory compromise, and reaction to medications. Opportunity for questions was given and appropriate answers were provided. Patient/patient representative has verbalized understanding is amenable to undergoing the procedure.   Jaynie Collins, DO  [facility]  Portions of the record may have been created with voice recognition software. Occasional wrong-word or 'sound-a-like' substitutions may have occurred due to the inherent limitations of voice recognition software.  Read the chart carefully and recognize, using context, where substitutions may have occurred.

## 2022-10-08 ENCOUNTER — Ambulatory Visit
Admission: RE | Admit: 2022-10-08 | Discharge: 2022-10-08 | Disposition: A | Discharge status: Home / Self Care | Payer: Medicare Other | Attending: Gastroenterology | Admitting: Gastroenterology

## 2022-10-08 ENCOUNTER — Encounter: Payer: Self-pay | Admitting: Gastroenterology

## 2022-10-08 ENCOUNTER — Other Ambulatory Visit: Payer: Self-pay

## 2022-10-08 ENCOUNTER — Ambulatory Visit: Payer: Medicare Other | Admitting: Certified Registered"

## 2022-10-08 ENCOUNTER — Encounter
Admission: RE | Disposition: A | Discharge status: Home / Self Care | Payer: Self-pay | Source: Home / Self Care | Attending: Gastroenterology

## 2022-10-08 DIAGNOSIS — D123 Benign neoplasm of transverse colon: Secondary | ICD-10-CM | POA: Diagnosis not present

## 2022-10-08 DIAGNOSIS — K640 First degree hemorrhoids: Secondary | ICD-10-CM | POA: Insufficient documentation

## 2022-10-08 DIAGNOSIS — Z7902 Long term (current) use of antithrombotics/antiplatelets: Secondary | ICD-10-CM | POA: Insufficient documentation

## 2022-10-08 DIAGNOSIS — M549 Dorsalgia, unspecified: Secondary | ICD-10-CM | POA: Diagnosis not present

## 2022-10-08 DIAGNOSIS — M1990 Unspecified osteoarthritis, unspecified site: Secondary | ICD-10-CM | POA: Diagnosis not present

## 2022-10-08 DIAGNOSIS — Z1211 Encounter for screening for malignant neoplasm of colon: Secondary | ICD-10-CM | POA: Diagnosis not present

## 2022-10-08 DIAGNOSIS — E669 Obesity, unspecified: Secondary | ICD-10-CM | POA: Insufficient documentation

## 2022-10-08 DIAGNOSIS — K219 Gastro-esophageal reflux disease without esophagitis: Secondary | ICD-10-CM | POA: Insufficient documentation

## 2022-10-08 DIAGNOSIS — Z955 Presence of coronary angioplasty implant and graft: Secondary | ICD-10-CM | POA: Diagnosis not present

## 2022-10-08 DIAGNOSIS — E119 Type 2 diabetes mellitus without complications: Secondary | ICD-10-CM | POA: Diagnosis not present

## 2022-10-08 DIAGNOSIS — I252 Old myocardial infarction: Secondary | ICD-10-CM | POA: Insufficient documentation

## 2022-10-08 DIAGNOSIS — E785 Hyperlipidemia, unspecified: Secondary | ICD-10-CM | POA: Diagnosis not present

## 2022-10-08 DIAGNOSIS — Z87891 Personal history of nicotine dependence: Secondary | ICD-10-CM | POA: Diagnosis not present

## 2022-10-08 DIAGNOSIS — I2510 Atherosclerotic heart disease of native coronary artery without angina pectoris: Secondary | ICD-10-CM | POA: Insufficient documentation

## 2022-10-08 DIAGNOSIS — I10 Essential (primary) hypertension: Secondary | ICD-10-CM | POA: Insufficient documentation

## 2022-10-08 DIAGNOSIS — Z6834 Body mass index (BMI) 34.0-34.9, adult: Secondary | ICD-10-CM | POA: Insufficient documentation

## 2022-10-08 DIAGNOSIS — D120 Benign neoplasm of cecum: Secondary | ICD-10-CM | POA: Diagnosis not present

## 2022-10-08 HISTORY — DX: Type 2 diabetes mellitus without complications: E11.9

## 2022-10-08 HISTORY — PX: COLONOSCOPY WITH PROPOFOL: SHX5780

## 2022-10-08 SURGERY — COLONOSCOPY WITH PROPOFOL
Anesthesia: General

## 2022-10-08 MED ORDER — PROPOFOL 500 MG/50ML IV EMUL
INTRAVENOUS | Status: DC | PRN
  Administered 2022-10-08: 150 ug/kg/min via INTRAVENOUS

## 2022-10-08 MED ORDER — SODIUM CHLORIDE 0.9 % IV SOLN: INTRAVENOUS | Status: DC

## 2022-10-08 MED ORDER — PROPOFOL 1000 MG/100ML IV EMUL
INTRAVENOUS | Status: AC
  Filled 2022-10-08: qty 100

## 2022-10-08 MED ORDER — PROPOFOL 10 MG/ML IV BOLUS
INTRAVENOUS | Status: DC | PRN
  Administered 2022-10-08: 7 mg via INTRAVENOUS

## 2022-10-08 MED ORDER — LIDOCAINE HCL (CARDIAC) PF 100 MG/5ML IV SOSY
PREFILLED_SYRINGE | INTRAVENOUS | Status: DC | PRN
  Administered 2022-10-08: 50 mg via INTRAVENOUS

## 2022-10-08 NOTE — Transfer of Care (Signed)
Immediate Anesthesia Transfer of Care Note  Patient: Eddie Alexander  Procedure(s) Performed: COLONOSCOPY WITH PROPOFOL  Patient Location: PACU and Endoscopy Unit  Anesthesia Type:General  Level of Consciousness: awake  Airway & Oxygen Therapy: Patient Spontanous Breathing  Post-op Assessment: Report given to RN and Post -op Vital signs reviewed and stable  Post vital signs: Reviewed and stable  Last Vitals:  Vitals Value Taken Time  BP 100/68 10/08/22 0809  Temp 36.2 C 10/08/22 0805  Pulse 81 10/08/22 0809  Resp 24 10/08/22 0809  SpO2 98 % 10/08/22 0809  Vitals shown include unvalidated device data.  Last Pain:  Vitals:   10/08/22 0805  TempSrc: Temporal  PainSc: Asleep         Complications: No notable events documented.

## 2022-10-08 NOTE — Anesthesia Preprocedure Evaluation (Addendum)
Anesthesia Evaluation  Patient identified by MRN, date of birth, ID band Patient awake    Reviewed: Allergy & Precautions, H&P , NPO status , Patient's Chart, lab work & pertinent test results  Airway Mallampati: III  TM Distance: >3 FB Neck ROM: full    Dental no notable dental hx.    Pulmonary former smoker   Pulmonary exam normal        Cardiovascular hypertension, Pt. on home beta blockers and Pt. on medications + CAD and + Past MI  Normal cardiovascular exam  CAD. H/o MI. S/p stent to LAD---> occluded, then re-stented.   Neuro/Psych  Neuromuscular disease (s/p cervical fusion)  negative psych ROS   GI/Hepatic Neg liver ROS,GERD  ,,  Endo/Other  diabetes, Type 2    Renal/GU negative Renal ROS  negative genitourinary   Musculoskeletal  (+) Arthritis ,    Abdominal  (+) + obese  Peds  Hematology negative hematology ROS (+)   Anesthesia Other Findings Past Medical History: No date: Arthritis No date: Back pain No date: CAD (coronary artery disease) No date: Cervical spondylosis with myelopathy No date: Diabetes mellitus without complication (HCC) No date: GERD (gastroesophageal reflux disease) No date: H/O urinary frequency No date: Hyperlipidemia No date: Hypertension 2011: Myocardial infarction [facility])     Comment:  caused VF arresr; s/p LAD bare metal stent; Sees Dr               Romeo Apple @ [facility] annualyy s/p stent  Past Surgical History: 01/08/2014: ANTERIOR CERVICAL DECOMP/DISCECTOMY FUSION; N/A     Comment:  Procedure: ANTERIOR CERVICAL DECOMPRESSION/DISCECTOMY               FUSION 2 LEVELS;  Surgeon: Tressie Stalker, MD;                Location: [facility] NEURO ORS;  Service: Neurosurgery;                Laterality: N/A;  Cervical Five-Six/Six-Seven Anterior               Cervical Decompression with Fusoin interbody prosthesis               plating and bonegraft 2011,2013: CORONARY ANGIOPLASTY     Comment:   Dr Bernette Redbird at [facility] No date: TONSILLECTOMY 05/10/2018: TOTAL HIP ARTHROPLASTY; Right 05/10/2018: TOTAL HIP ARTHROPLASTY; Right     Comment:  Procedure: RIGHT TOTAL HIP ARTHROPLASTY ANTERIOR               APPROACH;  Surgeon: Kathryne Hitch, MD;                Location: [facility] OR;  Service: Orthopedics;  Laterality:               Right; 05/09/2019: TOTAL HIP ARTHROPLASTY; Left     Comment:  Procedure: LEFT TOTAL HIP ARTHROPLASTY ANTERIOR               APPROACH;  Surgeon: Kathryne Hitch, MD;                Location: [facility] OR;  Service: Orthopedics;  Laterality:               Left; No date: WISDOM TOOTH EXTRACTION     Reproductive/Obstetrics negative OB ROS                             Anesthesia Physical Anesthesia Plan  ASA: 3  Anesthesia Plan: General   Post-op Pain Management:    Induction: Intravenous  PONV Risk Score and Plan: Propofol infusion and TIVA  Airway Management Planned: Natural Airway  Additional Equipment:   Intra-op Plan:   Post-operative Plan:   Informed Consent: I have reviewed the patients History and Physical, chart, labs and discussed the procedure including the risks, benefits and alternatives for the proposed anesthesia with the patient or authorized representative who has indicated his/her understanding and acceptance.     Dental Advisory Given  Plan Discussed with: CRNA and Surgeon  Anesthesia Plan Comments:         Anesthesia Quick Evaluation

## 2022-10-08 NOTE — Anesthesia Procedure Notes (Signed)
Procedure Name: MAC Date/Time: 10/08/2022 7:44 AM  Performed by: Cheral Bay, CRNAPre-anesthesia Checklist: Patient identified, Emergency Drugs available, Suction available, Patient being monitored and Timeout performed Patient Re-evaluated:Patient Re-evaluated prior to induction Oxygen Delivery Method: Nasal cannula Induction Type: IV induction Placement Confirmation: positive ETCO2 and CO2 detector

## 2022-10-08 NOTE — Op Note (Signed)
[facility] Gastroenterology Patient Name: Eddie Alexander Procedure Date: 10/08/2022 7:37 AM MRN: [id number] Account #: [id number] Date of Birth: 1960/09/26 Admit Type: Outpatient Age: 62 Room: [facility] ENDO ROOM 1 Gender: Male Note Status: Finalized Instrument Name: Colonoscope [id number] Procedure:             Colonoscopy Indications:           Screening for colorectal malignant neoplasm Providers:             Trenda Moots, DO Referring MD:          Daniel Nones, MD (Referring MD) Medicines:             Monitored Anesthesia Care Complications:         No immediate complications. Estimated blood loss:                         Minimal. Procedure:             Pre-Anesthesia Assessment:                        - Prior to the procedure, a History and Physical was                         performed, and patient medications and allergies were                         reviewed. The patient is competent. The risks and                         benefits of the procedure and the sedation options and                         risks were discussed with the patient. All questions                         were answered and informed consent was obtained.                         Patient identification and proposed procedure were                         verified by the physician, the nurse, the anesthetist                         and the technician in the endoscopy suite. Mental                         Status Examination: alert and oriented. Airway                         Examination: normal oropharyngeal airway and neck                         mobility. Respiratory Examination: clear to                         auscultation. CV Examination: RRR, no murmurs, no S3  or S4. Prophylactic Antibiotics: The patient does not                         require prophylactic antibiotics. Prior                         Anticoagulants: The patient has taken Plavix                          (clopidogrel), last dose was 6 days prior to                         procedure. ASA Grade Assessment: III - A patient with                         severe systemic disease. After reviewing the risks and                         benefits, the patient was deemed in satisfactory                         condition to undergo the procedure. The anesthesia                         plan was to use monitored anesthesia care (MAC).                         Immediately prior to administration of medications,                         the patient was re-assessed for adequacy to receive                         sedatives. The heart rate, respiratory rate, oxygen                         saturations, blood pressure, adequacy of pulmonary                         ventilation, and response to care were monitored                         throughout the procedure. The physical status of the                         patient was re-assessed after the procedure.                        After obtaining informed consent, the colonoscope was                         passed under direct vision. Throughout the procedure,                         the patient's blood pressure, pulse, and oxygen                         saturations were monitored continuously. The  Colonoscope was introduced through the anus and                         advanced to the the terminal ileum, with                         identification of the appendiceal orifice and IC                         valve. The colonoscopy was performed without                         difficulty. The patient tolerated the procedure well.                         The quality of the bowel preparation was evaluated                         using the BBPS [facility] Bowel Preparation Scale) with                         scores of: Right Colon = 2 (minor amount of residual                         staining, small fragments of stool and/or opaque                          liquid, but mucosa seen well), Transverse Colon = 2                         (minor amount of residual staining, small fragments of                         stool and/or opaque liquid, but mucosa seen well) and                         Left Colon = 3 (entire mucosa seen well with no                         residual staining, small fragments of stool or opaque                         liquid). The total BBPS score equals 7. The quality of                         the bowel preparation was good. The terminal ileum,                         ileocecal valve, appendiceal orifice, and rectum were                         photographed. Findings:      The perianal and digital rectal examinations were normal. Pertinent       negatives include normal sphincter tone.      The terminal ileum appeared normal. Estimated blood loss: none.      Two sessile polyps were found in the transverse colon  and cecum. The       polyps were 1 to 2 mm in size. These polyps were removed with a jumbo       cold forceps. Resection and retrieval were complete. Estimated blood       loss was minimal.      Non-bleeding internal hemorrhoids were found during retroflexion. The       hemorrhoids were Grade I (internal hemorrhoids that do not prolapse).      The exam was otherwise without abnormality on direct and retroflexion       views. Impression:            - The examined portion of the ileum was normal.                        - Two 1 to 2 mm polyps in the transverse colon and in                         the cecum, removed with a jumbo cold forceps. Resected                         and retrieved.                        - Non-bleeding internal hemorrhoids.                        - The examination was otherwise normal on direct and                         retroflexion views. Recommendation:        - Discharge patient to home.                        - Resume previous diet.                        - Continue present  medications.                        - Resume Plavix (clopidogrel) at prior dose tomorrow.                         Refer to managing physician for further adjustment of                         therapy.                        - Await pathology results.                        - Repeat colonoscopy for surveillance based on                         pathology results.                        - Return to referring physician as previously                         scheduled.                        -  The findings and recommendations were discussed with                         the patient. Procedure Code(s):     --- Professional ---                        [phone], Colonoscopy, flexible; with biopsy, single or                         multiple Diagnosis Code(s):     --- Professional ---                        Z12.11, Encounter for screening for malignant neoplasm                         of colon                        K64.0, First degree hemorrhoids                        D12.3, Benign neoplasm of transverse colon (hepatic                         flexure or splenic flexure)                        D12.0, Benign neoplasm of cecum CPT copyright 2022 [employer]. All rights reserved. The codes documented in this report are preliminary and upon coder review may  be revised to meet current compliance requirements. Attending Participation:      I personally performed the entire procedure. Elfredia Nevins, DO Jaynie Collins DO, DO 10/08/2022 8:05:17 AM This report has been signed electronically. Number of Addenda: 0 Note Initiated On: 10/08/2022 7:37 AM Scope Withdrawal Time: 0 hours 12 minutes 54 seconds  Total Procedure Duration: 0 hours 17 minutes 18 seconds  Estimated Blood Loss:  Estimated blood loss was minimal.      [facility]

## 2022-10-08 NOTE — Interval H&P Note (Signed)
History and Physical Interval Note: Preprocedure H&P from 10/08/22  was reviewed and there was no interval change after seeing and examining the patient.  Written consent was obtained from the patient after discussion of risks, benefits, and alternatives. Patient has consented to proceed with Colonoscopy with possible intervention   10/08/2022 7:35 AM  Eddie Alexander  has presented today for surgery, with the diagnosis of Z12.11  - Screening for colon cancer.  The various methods of treatment have been discussed with the patient and family. After consideration of risks, benefits and other options for treatment, the patient has consented to  Procedure(s): COLONOSCOPY WITH PROPOFOL (N/A) as a surgical intervention.  The patient's history has been reviewed, patient examined, no change in status, stable for surgery.  I have reviewed the patient's chart and labs.  Questions were answered to the patient's satisfaction.     Eddie Alexander

## 2022-10-08 NOTE — Anesthesia Postprocedure Evaluation (Signed)
Anesthesia Post Note  Patient: Eddie Alexander  Procedure(s) Performed: COLONOSCOPY WITH PROPOFOL  Patient location during evaluation: PACU Anesthesia Type: General Level of consciousness: awake and alert Pain management: pain level controlled Vital Signs Assessment: post-procedure vital signs reviewed and stable Respiratory status: spontaneous breathing, nonlabored ventilation and respiratory function stable Cardiovascular status: blood pressure returned to baseline and stable Postop Assessment: no apparent nausea or vomiting Anesthetic complications: no   No notable events documented.   Last Vitals:  Vitals:   10/08/22 0815 10/08/22 0825  BP: 103/75 106/75  Pulse:    Resp:    Temp:    SpO2: 99%     Last Pain:  Vitals:   10/08/22 0825  TempSrc:   PainSc: 0-No pain                 Foye Deer

## 2022-10-09 LAB — SURGICAL PATHOLOGY
